# Patient Record
Sex: Female | Born: 1935 | Race: White | Hispanic: No | Smoking: Never smoker
Health system: Southern US, Community
[De-identification: ages and names within clinical notes are randomized; demographics above are authoritative.]

## PROBLEM LIST (undated history)

## (undated) DIAGNOSIS — T7840XA Allergy, unspecified, initial encounter: Secondary | ICD-10-CM

## (undated) DIAGNOSIS — M1990 Unspecified osteoarthritis, unspecified site: Secondary | ICD-10-CM

## (undated) DIAGNOSIS — I059 Rheumatic mitral valve disease, unspecified: Secondary | ICD-10-CM

## (undated) DIAGNOSIS — M810 Age-related osteoporosis without current pathological fracture: Secondary | ICD-10-CM

## (undated) DIAGNOSIS — E039 Hypothyroidism, unspecified: Secondary | ICD-10-CM

## (undated) DIAGNOSIS — H353190 Nonexudative age-related macular degeneration, unspecified eye, stage unspecified: Secondary | ICD-10-CM

## (undated) DIAGNOSIS — I10 Essential (primary) hypertension: Secondary | ICD-10-CM

## (undated) HISTORY — DX: Allergy, unspecified, initial encounter: T78.40XA

## (undated) HISTORY — DX: Unspecified osteoarthritis, unspecified site: M19.90

## (undated) HISTORY — DX: Age-related osteoporosis without current pathological fracture: M81.0

## (undated) HISTORY — PX: EYE SURGERY: SHX253

## (undated) HISTORY — PX: CATARACT EXTRACTION: SUR2

## (undated) HISTORY — DX: Rheumatic mitral valve disease, unspecified: I05.9

## (undated) HISTORY — DX: Nonexudative age-related macular degeneration, unspecified eye, stage unspecified: H35.3190

## (undated) HISTORY — DX: Hypothyroidism, unspecified: E03.9

## (undated) HISTORY — PX: OOPHORECTOMY: SHX86

## (undated) HISTORY — DX: Essential (primary) hypertension: I10

## (undated) HISTORY — PX: SMALL BOWEL REPAIR: SHX6447

## (undated) HISTORY — PX: APPENDECTOMY: SHX54

## (undated) HISTORY — PX: TONSILLECTOMY: SUR1361

---

## 1973-09-07 HISTORY — PX: ABDOMINAL HYSTERECTOMY: SHX81

## 2016-11-05 DIAGNOSIS — H353222 Exudative age-related macular degeneration, left eye, with inactive choroidal neovascularization: Secondary | ICD-10-CM | POA: Diagnosis not present

## 2016-11-05 DIAGNOSIS — H353112 Nonexudative age-related macular degeneration, right eye, intermediate dry stage: Secondary | ICD-10-CM | POA: Diagnosis not present

## 2017-02-22 DIAGNOSIS — L814 Other melanin hyperpigmentation: Secondary | ICD-10-CM | POA: Diagnosis not present

## 2017-02-22 DIAGNOSIS — D1801 Hemangioma of skin and subcutaneous tissue: Secondary | ICD-10-CM | POA: Diagnosis not present

## 2017-02-22 DIAGNOSIS — H353112 Nonexudative age-related macular degeneration, right eye, intermediate dry stage: Secondary | ICD-10-CM | POA: Diagnosis not present

## 2017-02-22 DIAGNOSIS — H353222 Exudative age-related macular degeneration, left eye, with inactive choroidal neovascularization: Secondary | ICD-10-CM | POA: Diagnosis not present

## 2017-03-04 DIAGNOSIS — Z803 Family history of malignant neoplasm of breast: Secondary | ICD-10-CM | POA: Diagnosis not present

## 2017-03-04 DIAGNOSIS — Z1231 Encounter for screening mammogram for malignant neoplasm of breast: Secondary | ICD-10-CM | POA: Diagnosis not present

## 2017-03-04 LAB — HM MAMMOGRAPHY

## 2017-05-04 DIAGNOSIS — Z01419 Encounter for gynecological examination (general) (routine) without abnormal findings: Secondary | ICD-10-CM | POA: Diagnosis not present

## 2017-05-04 DIAGNOSIS — H353131 Nonexudative age-related macular degeneration, bilateral, early dry stage: Secondary | ICD-10-CM | POA: Diagnosis not present

## 2017-05-20 DIAGNOSIS — J019 Acute sinusitis, unspecified: Secondary | ICD-10-CM | POA: Diagnosis not present

## 2017-05-31 DIAGNOSIS — H353222 Exudative age-related macular degeneration, left eye, with inactive choroidal neovascularization: Secondary | ICD-10-CM | POA: Diagnosis not present

## 2017-05-31 DIAGNOSIS — H353112 Nonexudative age-related macular degeneration, right eye, intermediate dry stage: Secondary | ICD-10-CM | POA: Diagnosis not present

## 2017-06-16 DIAGNOSIS — R030 Elevated blood-pressure reading, without diagnosis of hypertension: Secondary | ICD-10-CM | POA: Diagnosis not present

## 2017-06-16 DIAGNOSIS — E039 Hypothyroidism, unspecified: Secondary | ICD-10-CM | POA: Diagnosis not present

## 2017-06-16 DIAGNOSIS — Z0001 Encounter for general adult medical examination with abnormal findings: Secondary | ICD-10-CM | POA: Diagnosis not present

## 2017-06-16 DIAGNOSIS — M810 Age-related osteoporosis without current pathological fracture: Secondary | ICD-10-CM | POA: Diagnosis not present

## 2017-06-16 LAB — HEPATIC FUNCTION PANEL
ALK PHOS: 66 (ref 25–125)
ALT: 13 (ref 7–35)
AST: 21 (ref 13–35)
Bilirubin, Total: 0.6

## 2017-06-16 LAB — LIPID PANEL
CHOLESTEROL: 215 — AB (ref 0–200)
HDL: 73 — AB (ref 35–70)
LDL CALC: 129
TRIGLYCERIDES: 65 (ref 40–160)

## 2017-06-16 LAB — BASIC METABOLIC PANEL
BUN: 11 (ref 4–21)
CREATININE: 0.8 (ref <=1.1)
Glucose: 73
Potassium: 4.6 (ref 3.4–5.3)
Sodium: 142 (ref 137–147)

## 2017-06-16 LAB — CBC AND DIFFERENTIAL
HEMATOCRIT: 41 (ref 36–46)
HEMOGLOBIN: 13.7 (ref 12.0–16.0)
PLATELETS: 267 (ref 150–399)
WBC: 6.3

## 2017-06-16 LAB — TSH: TSH: 3.95 (ref <=5.90)

## 2017-08-09 DIAGNOSIS — L821 Other seborrheic keratosis: Secondary | ICD-10-CM | POA: Diagnosis not present

## 2017-08-09 DIAGNOSIS — D1801 Hemangioma of skin and subcutaneous tissue: Secondary | ICD-10-CM | POA: Diagnosis not present

## 2017-08-09 DIAGNOSIS — L814 Other melanin hyperpigmentation: Secondary | ICD-10-CM | POA: Diagnosis not present

## 2017-09-09 DIAGNOSIS — H43813 Vitreous degeneration, bilateral: Secondary | ICD-10-CM | POA: Diagnosis not present

## 2017-09-09 DIAGNOSIS — H353222 Exudative age-related macular degeneration, left eye, with inactive choroidal neovascularization: Secondary | ICD-10-CM | POA: Diagnosis not present

## 2017-09-09 DIAGNOSIS — H353112 Nonexudative age-related macular degeneration, right eye, intermediate dry stage: Secondary | ICD-10-CM | POA: Diagnosis not present

## 2017-09-15 DIAGNOSIS — L910 Hypertrophic scar: Secondary | ICD-10-CM | POA: Diagnosis not present

## 2017-12-16 DIAGNOSIS — H353222 Exudative age-related macular degeneration, left eye, with inactive choroidal neovascularization: Secondary | ICD-10-CM | POA: Diagnosis not present

## 2017-12-16 DIAGNOSIS — H353112 Nonexudative age-related macular degeneration, right eye, intermediate dry stage: Secondary | ICD-10-CM | POA: Diagnosis not present

## 2017-12-22 DIAGNOSIS — M546 Pain in thoracic spine: Secondary | ICD-10-CM | POA: Diagnosis not present

## 2017-12-22 DIAGNOSIS — M9901 Segmental and somatic dysfunction of cervical region: Secondary | ICD-10-CM | POA: Diagnosis not present

## 2017-12-22 DIAGNOSIS — M9902 Segmental and somatic dysfunction of thoracic region: Secondary | ICD-10-CM | POA: Diagnosis not present

## 2017-12-22 DIAGNOSIS — M9903 Segmental and somatic dysfunction of lumbar region: Secondary | ICD-10-CM | POA: Diagnosis not present

## 2017-12-22 DIAGNOSIS — M542 Cervicalgia: Secondary | ICD-10-CM | POA: Diagnosis not present

## 2017-12-22 DIAGNOSIS — M545 Low back pain: Secondary | ICD-10-CM | POA: Diagnosis not present

## 2017-12-28 DIAGNOSIS — M546 Pain in thoracic spine: Secondary | ICD-10-CM | POA: Diagnosis not present

## 2017-12-28 DIAGNOSIS — M545 Low back pain: Secondary | ICD-10-CM | POA: Diagnosis not present

## 2017-12-28 DIAGNOSIS — M9902 Segmental and somatic dysfunction of thoracic region: Secondary | ICD-10-CM | POA: Diagnosis not present

## 2017-12-28 DIAGNOSIS — M542 Cervicalgia: Secondary | ICD-10-CM | POA: Diagnosis not present

## 2017-12-28 DIAGNOSIS — M9901 Segmental and somatic dysfunction of cervical region: Secondary | ICD-10-CM | POA: Diagnosis not present

## 2017-12-28 DIAGNOSIS — M9903 Segmental and somatic dysfunction of lumbar region: Secondary | ICD-10-CM | POA: Diagnosis not present

## 2017-12-29 DIAGNOSIS — M546 Pain in thoracic spine: Secondary | ICD-10-CM | POA: Diagnosis not present

## 2017-12-29 DIAGNOSIS — M542 Cervicalgia: Secondary | ICD-10-CM | POA: Diagnosis not present

## 2017-12-29 DIAGNOSIS — M9902 Segmental and somatic dysfunction of thoracic region: Secondary | ICD-10-CM | POA: Diagnosis not present

## 2017-12-29 DIAGNOSIS — M9903 Segmental and somatic dysfunction of lumbar region: Secondary | ICD-10-CM | POA: Diagnosis not present

## 2017-12-29 DIAGNOSIS — M9901 Segmental and somatic dysfunction of cervical region: Secondary | ICD-10-CM | POA: Diagnosis not present

## 2017-12-29 DIAGNOSIS — M545 Low back pain: Secondary | ICD-10-CM | POA: Diagnosis not present

## 2017-12-30 DIAGNOSIS — M9903 Segmental and somatic dysfunction of lumbar region: Secondary | ICD-10-CM | POA: Diagnosis not present

## 2017-12-30 DIAGNOSIS — M546 Pain in thoracic spine: Secondary | ICD-10-CM | POA: Diagnosis not present

## 2017-12-30 DIAGNOSIS — M545 Low back pain: Secondary | ICD-10-CM | POA: Diagnosis not present

## 2017-12-30 DIAGNOSIS — M9902 Segmental and somatic dysfunction of thoracic region: Secondary | ICD-10-CM | POA: Diagnosis not present

## 2017-12-30 DIAGNOSIS — M542 Cervicalgia: Secondary | ICD-10-CM | POA: Diagnosis not present

## 2017-12-30 DIAGNOSIS — M9901 Segmental and somatic dysfunction of cervical region: Secondary | ICD-10-CM | POA: Diagnosis not present

## 2018-01-03 DIAGNOSIS — M545 Low back pain: Secondary | ICD-10-CM | POA: Diagnosis not present

## 2018-01-03 DIAGNOSIS — M9902 Segmental and somatic dysfunction of thoracic region: Secondary | ICD-10-CM | POA: Diagnosis not present

## 2018-01-03 DIAGNOSIS — M542 Cervicalgia: Secondary | ICD-10-CM | POA: Diagnosis not present

## 2018-01-03 DIAGNOSIS — M546 Pain in thoracic spine: Secondary | ICD-10-CM | POA: Diagnosis not present

## 2018-01-03 DIAGNOSIS — M9903 Segmental and somatic dysfunction of lumbar region: Secondary | ICD-10-CM | POA: Diagnosis not present

## 2018-01-03 DIAGNOSIS — M9901 Segmental and somatic dysfunction of cervical region: Secondary | ICD-10-CM | POA: Diagnosis not present

## 2018-01-06 DIAGNOSIS — M9901 Segmental and somatic dysfunction of cervical region: Secondary | ICD-10-CM | POA: Diagnosis not present

## 2018-01-06 DIAGNOSIS — M542 Cervicalgia: Secondary | ICD-10-CM | POA: Diagnosis not present

## 2018-01-06 DIAGNOSIS — M9903 Segmental and somatic dysfunction of lumbar region: Secondary | ICD-10-CM | POA: Diagnosis not present

## 2018-01-06 DIAGNOSIS — M9902 Segmental and somatic dysfunction of thoracic region: Secondary | ICD-10-CM | POA: Diagnosis not present

## 2018-01-06 DIAGNOSIS — M546 Pain in thoracic spine: Secondary | ICD-10-CM | POA: Diagnosis not present

## 2018-01-06 DIAGNOSIS — M545 Low back pain: Secondary | ICD-10-CM | POA: Diagnosis not present

## 2018-01-11 DIAGNOSIS — M9901 Segmental and somatic dysfunction of cervical region: Secondary | ICD-10-CM | POA: Diagnosis not present

## 2018-01-11 DIAGNOSIS — M545 Low back pain: Secondary | ICD-10-CM | POA: Diagnosis not present

## 2018-01-11 DIAGNOSIS — M542 Cervicalgia: Secondary | ICD-10-CM | POA: Diagnosis not present

## 2018-01-11 DIAGNOSIS — M9903 Segmental and somatic dysfunction of lumbar region: Secondary | ICD-10-CM | POA: Diagnosis not present

## 2018-01-11 DIAGNOSIS — M9902 Segmental and somatic dysfunction of thoracic region: Secondary | ICD-10-CM | POA: Diagnosis not present

## 2018-01-11 DIAGNOSIS — M546 Pain in thoracic spine: Secondary | ICD-10-CM | POA: Diagnosis not present

## 2018-01-17 DIAGNOSIS — M9901 Segmental and somatic dysfunction of cervical region: Secondary | ICD-10-CM | POA: Diagnosis not present

## 2018-01-17 DIAGNOSIS — M542 Cervicalgia: Secondary | ICD-10-CM | POA: Diagnosis not present

## 2018-01-17 DIAGNOSIS — M9903 Segmental and somatic dysfunction of lumbar region: Secondary | ICD-10-CM | POA: Diagnosis not present

## 2018-01-17 DIAGNOSIS — M9902 Segmental and somatic dysfunction of thoracic region: Secondary | ICD-10-CM | POA: Diagnosis not present

## 2018-01-17 DIAGNOSIS — M546 Pain in thoracic spine: Secondary | ICD-10-CM | POA: Diagnosis not present

## 2018-01-17 DIAGNOSIS — M545 Low back pain: Secondary | ICD-10-CM | POA: Diagnosis not present

## 2018-04-05 DIAGNOSIS — E039 Hypothyroidism, unspecified: Secondary | ICD-10-CM | POA: Diagnosis not present

## 2018-04-05 DIAGNOSIS — I10 Essential (primary) hypertension: Secondary | ICD-10-CM | POA: Diagnosis not present

## 2018-04-05 DIAGNOSIS — I059 Rheumatic mitral valve disease, unspecified: Secondary | ICD-10-CM | POA: Diagnosis not present

## 2018-04-05 LAB — BASIC METABOLIC PANEL
BUN: 15 (ref 4–21)
CREATININE: 0.8 (ref <=1.1)
Glucose: 91
POTASSIUM: 4.3 (ref 3.4–5.3)
Sodium: 144 (ref 137–147)

## 2018-04-05 LAB — TSH: TSH: 0.16 — AB (ref 0.41–5.90)

## 2018-04-06 DIAGNOSIS — Z780 Asymptomatic menopausal state: Secondary | ICD-10-CM | POA: Diagnosis not present

## 2018-04-06 DIAGNOSIS — Z9223 Personal history of estrogen therapy: Secondary | ICD-10-CM | POA: Diagnosis not present

## 2018-04-06 DIAGNOSIS — Z79899 Other long term (current) drug therapy: Secondary | ICD-10-CM | POA: Diagnosis not present

## 2018-04-06 DIAGNOSIS — Z1231 Encounter for screening mammogram for malignant neoplasm of breast: Secondary | ICD-10-CM | POA: Diagnosis not present

## 2018-04-06 DIAGNOSIS — Z79890 Hormone replacement therapy (postmenopausal): Secondary | ICD-10-CM | POA: Diagnosis not present

## 2018-04-06 DIAGNOSIS — M810 Age-related osteoporosis without current pathological fracture: Secondary | ICD-10-CM | POA: Diagnosis not present

## 2018-04-06 DIAGNOSIS — E079 Disorder of thyroid, unspecified: Secondary | ICD-10-CM | POA: Diagnosis not present

## 2018-04-06 LAB — HM MAMMOGRAPHY

## 2018-04-07 LAB — HM DEXA SCAN

## 2018-04-27 DIAGNOSIS — H353131 Nonexudative age-related macular degeneration, bilateral, early dry stage: Secondary | ICD-10-CM | POA: Diagnosis not present

## 2018-06-30 DIAGNOSIS — Z01419 Encounter for gynecological examination (general) (routine) without abnormal findings: Secondary | ICD-10-CM | POA: Diagnosis not present

## 2018-07-05 ENCOUNTER — Encounter: Payer: Self-pay | Admitting: Family Medicine

## 2018-07-05 NOTE — Progress Notes (Signed)
Medical records are received from previous PCP [facility] internal medicine in [place].  These were personally reviewed by me  - Most recent office visit 04/05/2018 showed medication list of calcium 600 mg twice daily, lisinopril 10 mg daily, Synthroid 100 mcg daily-brand-name only, vitamin D 1000 units twice daily, and Vivelle-Dot 0.025 mg daily - Last mammogram 03/04/2017 BI-RADS 2-abstracted into the chart -Past medical history Per previous PCP includes hypothyroidism, dry macular degeneration, mitral valve disorder, and osteoporosis -Family medical history shows that her mother and father both died in car accident and her sister has a history of breast cancer at unknown age - Note states that she is a never smoker and uses no tobacco, alcohol, or drugs - She has no surgical history -Most recent labs from 2018 in 2019 abstracted into the chart -Last Medicare annual wellness visit 06/16/2017 -No vaccination record attached  Virginia Crews, MD, MPH [facility] 07/05/2018 10:46 AM

## 2018-07-08 ENCOUNTER — Ambulatory Visit (INDEPENDENT_AMBULATORY_CARE_PROVIDER_SITE_OTHER): Payer: Medicare Other | Admitting: Family Medicine

## 2018-07-08 ENCOUNTER — Encounter: Payer: Self-pay | Admitting: Family Medicine

## 2018-07-08 VITALS — BP 174/89 | HR 65 | Temp 97.6°F | Ht 64.5 in | Wt 122.4 lb

## 2018-07-08 DIAGNOSIS — E039 Hypothyroidism, unspecified: Secondary | ICD-10-CM

## 2018-07-08 DIAGNOSIS — I10 Essential (primary) hypertension: Secondary | ICD-10-CM | POA: Diagnosis not present

## 2018-07-08 DIAGNOSIS — M810 Age-related osteoporosis without current pathological fracture: Secondary | ICD-10-CM | POA: Insufficient documentation

## 2018-07-08 DIAGNOSIS — H353190 Nonexudative age-related macular degeneration, unspecified eye, stage unspecified: Secondary | ICD-10-CM | POA: Insufficient documentation

## 2018-07-08 DIAGNOSIS — Q782 Osteopetrosis: Secondary | ICD-10-CM | POA: Insufficient documentation

## 2018-07-08 MED ORDER — AMLODIPINE BESYLATE 5 MG PO TABS: 5.0000 mg | ORAL_TABLET | Freq: Every day | ORAL | 3 refills | Status: DC

## 2018-07-08 NOTE — Progress Notes (Signed)
Patient: Megan Woods, Female    DOB: [birth year], 83 y.o.   MRN: [id number] Visit Date: 07/11/2018  Today's Provider: Lavon Paganini, MD   Chief Complaint  Patient presents with  . Establish Care   Subjective:    New Patient:  Megan Woods is a 83 y.o. female who presents today to establish care. Patient was previously a patient at [facility] in [place].  PCP retired a few months ago.  She feels well. She reports exercising walking 1.5 miles 3 days per week. She reports she is sleeping well.  Osteoporosis.  She states that DEXA was in 01/2018 (she also had a mammogram at this time)  Hypothyroidism: Taking Synthroid 168mcg daily with good compliance. No skin/hair changes, constipaiton, diarrhea, palpitations, cold/heat intolerance  Dry Macular degeneration: Sees Optho in [place] (where she lives in the winter).  Has injections periodically.  Sees optometry annually in [place]. Hasn't had any problems recently  She is stressed currently because husband was recently diagnosed with metastatic prostate cancer.  They have ended up staying here longer than planned.  This has caused high blood pressure (was also high at Gyn last week).   Started elevation in July 2019 after his diagnosis.  Was prescribed low dose lisinopril but admits that she never took it.  She lives in [place], here in [place], [place], and [place].  She and her husband travel between these places and own homes in all 5 locations. -----------------------------------------------------------------   Review of Systems  Constitutional: Negative.   HENT: Negative.   Eyes: Positive for photophobia.  Respiratory: Negative.   Cardiovascular: Negative.   Gastrointestinal: Negative.   Endocrine: Negative.   Genitourinary: Negative.   Musculoskeletal: Negative.   Skin: Negative.   Allergic/Immunologic: Positive for food allergies.  Neurological: Negative.   Hematological: Negative.     Psychiatric/Behavioral: Negative.     Social History      She  reports that she has never smoked. She has never used smokeless tobacco. She reports that she does not drink alcohol or use drugs.       Social History   Socioeconomic History  . Marital status: Married    Spouse name: Not on file  . Number of children: Not on file  . Years of education: Not on file  . Highest education level: Not on file  Occupational History  . Not on file  Social Needs  . Financial resource strain: Not on file  . Food insecurity:    Worry: Not on file    Inability: Not on file  . Transportation needs:    Medical: Not on file    Non-medical: Not on file  Tobacco Use  . Smoking status: Never Smoker  . Smokeless tobacco: Never Used  Substance and Sexual Activity  . Alcohol use: Never    Frequency: Never  . Drug use: Never  . Sexual activity: Not on file  Lifestyle  . Physical activity:    Days per week: Not on file    Minutes per session: Not on file  . Stress: Not on file  Relationships  . Social connections:    Talks on phone: Not on file    Gets together: Not on file    Attends religious service: Not on file    Active member of club or organization: Not on file    Attends meetings of clubs or organizations: Not on file    Relationship status: Not on file  Other Topics Concern  . Not on file  Social History Narrative  . Not on file    Past Medical History:  Diagnosis Date  . Allergy   . 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 Hypothyroidism   . Mitral valve disorder   . Osteoporosis      Patient Active Problem List   Diagnosis Date Noted  . Essential hypertension 07/11/2018  . 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 Hypothyroidism   . Osteoporosis     Past Surgical History:  Procedure Laterality Date  . ABDOMINAL HYSTERECTOMY  1975   also took out L ovary and cervix  . APPENDECTOMY    . CATARACT EXTRACTION    . OOPHORECTOMY     R ovary removed many years  after hysterectomy for benign cyst  . SMALL BOWEL REPAIR     perforated during oophorectomy  . TONSILLECTOMY      Family History        Family Status  Relation Name Status  . Mother  Deceased       car accident  . Father  Deceased       car accident   . Sister  (Not Specified)        Her family history includes Breast cancer (age of onset: 32) in her sister.      Allergies  Allergen Reactions  . Chocolate     Other reaction(s): other/intolerance  . Iodine     Other reaction(s): mild rash/itching  . Penicillins Swelling    Other reaction(s): mild rash/itching  . Shellfish Allergy     Other reaction(s): other/intolerance "I pass out"  . Sulfur     Other reaction(s): mild rash/itching     Current Outpatient Medications:  .  Calcium Carbonate (CALCIUM 600 PO), Take 600 mg by mouth 2 (two) times daily., Disp: , Rfl:  .  Cholecalciferol (VITAMIN D PO), Take 1,000 mg by mouth daily., Disp: , Rfl:  .  estradiol (VIVELLE-DOT) 0.0375 MG/24HR, Place onto the skin., Disp: , Rfl:  .  levothyroxine (SYNTHROID, LEVOTHROID) 100 MCG tablet, Take by mouth., Disp: , Rfl:  .  amLODipine (NORVASC) 5 MG tablet, Take 1 tablet (5 mg total) by mouth daily., Disp: 30 tablet, Rfl: 3   Patient Care Team: [place] Crews, MD as PCP - General (Family Medicine)      Objective:   Vitals: BP (!) 174/89 (BP Location: Right Arm, Patient Position: Sitting, Cuff Size: Normal)   Pulse 65   Temp 97.6 F (36.4 C) (Oral)   Ht 5' 4.5" (1.638 m)   Wt 122 lb 6.4 oz (55.5 kg)   SpO2 97%   BMI 20.69 kg/m    Vitals:   07/08/18 1534  BP: (!) 174/89  Pulse: 65  Temp: 97.6 F (36.4 C)  TempSrc: Oral  SpO2: 97%  Weight: 122 lb 6.4 oz (55.5 kg)  Height: 5' 4.5" (1.638 m)     Physical Exam  Constitutional: She is oriented to person, place, and time. She appears well-developed and well-nourished. No distress.  HENT:  Head: Normocephalic and atraumatic.  Right Ear: External ear normal.  Left  Ear: External ear normal.  Nose: Nose normal.  Mouth/Throat: Oropharynx is clear and moist.  Eyes: Pupils are equal, round, and reactive to light. Conjunctivae and EOM are normal. No scleral icterus.  Neck: Neck supple. No thyromegaly present.  Cardiovascular: Normal rate, regular rhythm, normal heart sounds and intact distal pulses.  No murmur heard. Pulmonary/Chest: Effort normal  and breath sounds normal. No respiratory distress. She has no wheezes. She has no rales.  Abdominal: Soft. Bowel sounds are normal. She exhibits no distension. There is no tenderness. There is no rebound and no guarding.  Musculoskeletal: She exhibits no edema or deformity.  Lymphadenopathy:    She has no cervical adenopathy.  Neurological: She is alert and oriented to person, place, and time.  Skin: Skin is warm and dry. Capillary refill takes less than 2 seconds. No rash noted.  Psychiatric: She has a normal mood and affect. Her behavior is normal.  Vitals reviewed.    Depression Screen PHQ 2/9 Scores 07/08/2018  PHQ - 2 Score 0  PHQ- 9 Score 0      Assessment & Plan:     Establish Care  Exercise Activities and Dietary recommendations Goals   None      There is no immunization history on file for this patient.  Health Maintenance  Topic Date Due  . TETANUS/TDAP  06/04/1956  . PNA vac Low Risk Adult (1 of 2 - PCV13) 06/04/2002  . INFLUENZA VACCINE  01/05/2019 (Originally 04/07/2018)  . DEXA SCAN  Completed     Discussed health benefits of physical activity, and encouraged her to engage in regular exercise appropriate for her age and condition.   See separate note from records review   --------------------------------------------------------------------  Problem List Items Addressed This Visit      Cardiovascular and Mediastinum   Essential hypertension    New diagnosis Patient is wary of taking antihypertensives because she believes that BP is only elevated due to  stress Discussed that no matter why BP is elevated, if it is consistently elevated, it needs to be lowered to lower health risks, including risk of MI and CVA She agrees to try amlodipine 5mg  daily Will f/u in 1 month and see how BP is and possible titrate dose Reviewed recent BMP      Relevant Medications   amLODipine (NORVASC) 5 MG tablet     Endocrine   Hypothyroidism - Primary    Previously well controlled Reviewed records from previous PCP Will abstract last TSH Continue current Synthroid dose      Relevant Medications   levothyroxine (SYNTHROID, LEVOTHROID) 100 MCG tablet     Musculoskeletal and Integument   Osteoporosis    Reviewed recent DEXA She is on HRT      Relevant Medications   Calcium Carbonate (CALCIUM 600 PO)   Cholecalciferol (VITAMIN D PO)     Other   Dry age-related macular degeneration    Followed by Optho No red flags currently          Return in about 6 months (around 01/06/2019) for AWV/CPE. And in 1 month for BP f/u   Approximately 45 minutes was spent in discussion of which greater than 50% was consultation.   The entirety of the information documented in the History of Present Illness, Review of Systems and Physical Exam were personally obtained by me. Portions of this information were initially documented by Tiburcio Pea, CMA and reviewed by me for thoroughness and accuracy.    [place]ia Crews, MD, MPH [facility] 07/11/2018 11:01 AM

## 2018-07-08 NOTE — Patient Instructions (Signed)

## 2018-07-11 DIAGNOSIS — I10 Essential (primary) hypertension: Secondary | ICD-10-CM | POA: Insufficient documentation

## 2018-07-11 NOTE — Assessment & Plan Note (Signed)
Reviewed recent DEXA She is on HRT

## 2018-07-11 NOTE — Assessment & Plan Note (Signed)
Followed by Optho No red flags currently

## 2018-07-11 NOTE — Assessment & Plan Note (Signed)
New diagnosis Patient is wary of taking antihypertensives because she believes that BP is only elevated due to stress Discussed that no matter why BP is elevated, if it is consistently elevated, it needs to be lowered to lower health risks, including risk of MI and CVA She agrees to try amlodipine 5mg  daily Will f/u in 1 month and see how BP is and possible titrate dose Reviewed recent BMP

## 2018-07-11 NOTE — Assessment & Plan Note (Signed)
Previously well controlled Reviewed records from previous PCP Will abstract last TSH Continue current Synthroid dose

## 2018-08-12 ENCOUNTER — Ambulatory Visit (INDEPENDENT_AMBULATORY_CARE_PROVIDER_SITE_OTHER): Payer: Medicare Other | Admitting: Family Medicine

## 2018-08-12 ENCOUNTER — Encounter: Payer: Self-pay | Admitting: Family Medicine

## 2018-08-12 VITALS — BP 147/75 | HR 77 | Temp 97.7°F | Wt 123.2 lb

## 2018-08-12 DIAGNOSIS — I10 Essential (primary) hypertension: Secondary | ICD-10-CM

## 2018-08-12 DIAGNOSIS — E039 Hypothyroidism, unspecified: Secondary | ICD-10-CM

## 2018-08-12 MED ORDER — AMLODIPINE BESYLATE 2.5 MG PO TABS: 7.5000 mg | ORAL_TABLET | Freq: Every day | ORAL | 3 refills | Status: DC

## 2018-08-12 NOTE — Assessment & Plan Note (Addendum)
Improving but not to goal Tolerating amlodipine well, but still hesitant to take medicaitons She also plans to increase exercise as she goes to [place]  Since she is so hesitant about medicaiton, will compromise and increase amlodipine to 7.5mg  daily She will be out of the area until f/u in May, but she will call if BP

## 2018-08-12 NOTE — Patient Instructions (Signed)

## 2018-08-12 NOTE — Progress Notes (Signed)
Patient: Megan Woods Female    DOB: [birth year]   83 y.o.   MRN: [id number] Visit Date: 08/12/2018  Today's Provider: Lavon Paganini, MD   Chief Complaint  Patient presents with  . Hypertension  . Hypothyroidism   Subjective:    HPI  Hypertension, follow-up:  BP Readings from Last 3 Encounters:  08/12/18 (!) 147/75  07/08/18 (!) 174/89    She was last seen for hypertension 1 months ago.  BP at that visit was 174/89. Management changes since that visit include started Amlodipine 5 mg. She reports good compliance with treatment. She is not having side effects.  She is exercising. She is adherent to low salt diet.   Outside blood pressures are being checked. BP readings are elevated. She is experiencing none.  Patient denies chest pain, chest pressure/discomfort, claudication, dyspnea, exertional chest pressure/discomfort, fatigue, irregular heart beat, lower extremity edema, near-syncope, orthopnea, palpitations, paroxysmal nocturnal dyspnea, syncope and tachypnea.   Cardiovascular risk factors include advanced age (older than 59 for men, 67 for women) and hypertension.  Use of agents associated with hypertension: thyroid hormones.     Weight trend: stable Wt Readings from Last 3 Encounters:  08/12/18 123 lb 3.2 oz (55.9 kg)  07/08/18 122 lb 6.4 oz (55.5 kg)    Current diet: in general, a "healthy" diet  , well balanced  ------------------------------------------------------------------------   Hypothyroid, follow-up:  TSH  Date Value Ref Range Status  04/05/2018 0.16 (A) 0.41 - 5.90 Final  06/16/2017 3.95 0.41 - 5.90 Final   Wt Readings from Last 3 Encounters:  08/12/18 123 lb 3.2 oz (55.9 kg)  07/08/18 122 lb 6.4 oz (55.5 kg)    She was last seen for hypothyroid 1 months ago.  Management since that visit includes no changes. She reports good compliance with treatment. She is not having side effects.  She is exercising. She is experiencing  none She denies change in energy level, diarrhea, heat / cold intolerance, nervousness, palpitations and weight changes Weight trend: stable  ------------------------------------------------------------------------   Allergies  Allergen Reactions  . Chocolate     Other reaction(s): other/intolerance  . Iodine     Other reaction(s): mild rash/itching  . Penicillins Swelling    Other reaction(s): mild rash/itching  . Shellfish Allergy     Other reaction(s): other/intolerance "I pass out"  . Sulfur     Other reaction(s): mild rash/itching     Current Outpatient Medications:  .  amLODipine (NORVASC) 5 MG tablet, Take 1 tablet (5 mg total) by mouth daily., Disp: 30 tablet, Rfl: 3 .  Calcium Carbonate (CALCIUM 600 PO), Take 600 mg by mouth 2 (two) times daily., Disp: , Rfl:  .  Cholecalciferol (VITAMIN D PO), Take 1,000 mg by mouth daily., Disp: , Rfl:  .  estradiol (VIVELLE-DOT) 0.0375 MG/24HR, Place onto the skin., Disp: , Rfl:  .  levothyroxine (SYNTHROID, LEVOTHROID) 100 MCG tablet, Take by mouth., Disp: , Rfl:   Review of Systems  Constitutional: Negative.   Respiratory: Negative.   Cardiovascular: Negative.   Genitourinary: Negative.   Musculoskeletal: Negative.   Neurological: Negative.   Psychiatric/Behavioral: Negative.     Social History   Tobacco Use  . Smoking status: Never Smoker  . Smokeless tobacco: Never Used  Substance Use Topics  . Alcohol use: Never    Frequency: Never   Objective:   BP (!) 147/75 (BP Location: Left Arm, Patient Position: Sitting, Cuff Size: Normal)   Pulse 77   Temp 97.7  F (36.5 C) (Oral)   Wt 123 lb 3.2 oz (55.9 kg)   SpO2 97%   BMI 20.82 kg/m  Vitals:   08/12/18 0953  BP: (!) 147/75  Pulse: 77  Temp: 97.7 F (36.5 C)  TempSrc: Oral  SpO2: 97%  Weight: 123 lb 3.2 oz (55.9 kg)     Physical Exam  Constitutional: She is oriented to person, place, and time. She appears well-developed and well-nourished. No distress.   HENT:  Head: Normocephalic and atraumatic.  Eyes: Conjunctivae are normal. No scleral icterus.  Neck: Neck supple. No thyromegaly present.  Cardiovascular: Normal rate, regular rhythm, normal heart sounds and intact distal pulses.  No murmur heard. Pulmonary/Chest: Effort normal and breath sounds normal. No respiratory distress. She has no wheezes. She has no rales.  Musculoskeletal: She exhibits no edema.  Lymphadenopathy:    She has no cervical adenopathy.  Neurological: She is alert and oriented to person, place, and time.  Skin: Skin is warm and dry. Capillary refill takes less than 2 seconds. No rash noted.  Psychiatric: She has a normal mood and affect. Her behavior is normal.  Vitals reviewed.       Assessment & Plan:   Problem List Items Addressed This Visit      Cardiovascular and Mediastinum   Essential hypertension - Primary    Improving but not to goal Tolerating amlodipine well, but still hesitant to take medicaitons She also plans to increase exercise as she goes to [place]  Since she is so hesitant about medicaiton, will compromise and increase amlodipine to 7.5mg  daily She will be out of the area until f/u in May, but she will call if BP       Relevant Medications   amLODipine (NORVASC) 2.5 MG tablet   Other Relevant Orders   Comprehensive metabolic panel   Lipid panel     Endocrine   Hypothyroidism    Last TSH slightly low Will recheck today Continue Synthroid pending labs      Relevant Orders   TSH       Return in about 5 months (around 01/11/2019) for as scheduled for BP f/u.   The entirety of the information documented in the History of Present Illness, Review of Systems and Physical Exam were personally obtained by me. Portions of this information were initially documented by Tiburcio Pea, CMA and reviewed by me for thoroughness and accuracy.    [place]ia Crews, MD, MPH [facility] 08/12/2018 1:02 PM

## 2018-08-12 NOTE — Assessment & Plan Note (Signed)
Last TSH slightly low Will recheck today Continue Synthroid pending labs

## 2018-08-13 LAB — COMPREHENSIVE METABOLIC PANEL
ALT: 12 IU/L (ref 0–32)
AST: 20 IU/L (ref 0–40)
Albumin/Globulin Ratio: 3.5 — ABNORMAL HIGH (ref 1.2–2.2)
Albumin: 4.5 g/dL (ref 3.5–4.7)
Alkaline Phosphatase: 67 IU/L (ref 39–117)
BUN / CREAT RATIO: 14 (ref 12–28)
BUN: 13 mg/dL (ref 8–27)
Bilirubin Total: 0.4 mg/dL (ref 0.0–1.2)
CO2: 21 mmol/L (ref 20–29)
Calcium: 9.3 mg/dL (ref 8.7–10.3)
Chloride: 104 mmol/L (ref 96–106)
Creatinine, Ser: 0.91 mg/dL (ref 0.57–1.00)
GFR calc non Af Amer: 59 mL/min/{1.73_m2} — ABNORMAL LOW (ref >59)
GFR, EST AFRICAN AMERICAN: 68 mL/min/{1.73_m2} (ref >59)
GLUCOSE: 127 mg/dL — AB (ref 65–99)
Globulin, Total: 1.3 g/dL — ABNORMAL LOW (ref 1.5–4.5)
Potassium: 4.1 mmol/L (ref 3.5–5.2)
SODIUM: 142 mmol/L (ref 134–144)
TOTAL PROTEIN: 5.8 g/dL — AB (ref 6.0–8.5)

## 2018-08-13 LAB — LIPID PANEL
CHOL/HDL RATIO: 2.3 ratio (ref 0.0–4.4)
Cholesterol, Total: 198 mg/dL (ref 100–199)
HDL: 86 mg/dL (ref >39)
LDL CALC: 95 mg/dL (ref 0–99)
TRIGLYCERIDES: 87 mg/dL (ref 0–149)
VLDL Cholesterol Cal: 17 mg/dL (ref 5–40)

## 2018-08-13 LAB — TSH: TSH: 1.8 u[IU]/mL (ref 0.450–4.500)

## 2018-08-15 ENCOUNTER — Telehealth: Payer: Self-pay

## 2018-08-15 ENCOUNTER — Other Ambulatory Visit: Payer: Self-pay

## 2018-08-15 MED ORDER — LEVOTHYROXINE SODIUM 100 MCG PO TABS: 100.0000 ug | ORAL_TABLET | Freq: Every day | ORAL | 3 refills | Status: DC

## 2018-08-15 NOTE — Telephone Encounter (Signed)
-----   Message from [place] Crews, MD sent at 08/15/2018  8:16 AM EST ----- Normal labs

## 2018-08-15 NOTE — Telephone Encounter (Signed)
Patient was advised.  

## 2018-08-15 NOTE — Telephone Encounter (Signed)
Patient is calling office requesting refill on Synthroid, patient would like this sent to [facility] in [facility]. Patient states stores phone number is [phone] and stores address is [address] [place], [place] [address]

## 2018-08-30 ENCOUNTER — Telehealth: Payer: Self-pay

## 2018-08-30 NOTE — Telephone Encounter (Signed)
-----   Message from Trinna Post, [place] sent at 08/30/2018  9:57 AM EST ----- Cholesterol slightly elevated but the benefit of cholesterol medication at this point is questionable. Discuss with dr b at follow up visit but would not start any medication just yet.

## 2018-08-30 NOTE — Telephone Encounter (Signed)
-----   Message from Trinna Post, [place] sent at 08/30/2018  9:55 AM EST ----- Slightly over corrected thyroid. Recommend 88 mcg daily thyroid daily and follow up OV 6 weeks for labs and recheck.

## 2018-08-30 NOTE — Telephone Encounter (Signed)
Patient advised as directed below. Patient reported that at this moment she is not able to schedule a  Follow-up appointment because she is currently staying in [place]. Patient reports that she is going to call to talk to Dr.B

## 2018-10-14 DIAGNOSIS — H353222 Exudative age-related macular degeneration, left eye, with inactive choroidal neovascularization: Secondary | ICD-10-CM | POA: Diagnosis not present

## 2018-10-14 DIAGNOSIS — H353112 Nonexudative age-related macular degeneration, right eye, intermediate dry stage: Secondary | ICD-10-CM | POA: Diagnosis not present

## 2018-10-18 ENCOUNTER — Telehealth: Payer: Self-pay | Admitting: Family Medicine

## 2018-10-18 NOTE — Telephone Encounter (Signed)
Pt's daughter Rolanda Lundborg - [phone]   Pt is needing a colonoscopy referral  prefer -  hospital Week of March 17, 18 and 20  Please advise.  Thanks, [employer]

## 2018-10-19 NOTE — Telephone Encounter (Signed)
LMTCB

## 2018-10-19 NOTE — Telephone Encounter (Signed)
I do not have a report from previous colonoscopy.  Does patient have history of polyps?  If she had normal screenings, we typically stop routine screening at age 84.  We can still place a referral to GI if she prefers, but it is possible they will tell her the same thing

## 2018-10-21 DIAGNOSIS — D485 Neoplasm of uncertain behavior of skin: Secondary | ICD-10-CM | POA: Diagnosis not present

## 2018-10-21 DIAGNOSIS — Z85828 Personal history of other malignant neoplasm of skin: Secondary | ICD-10-CM | POA: Diagnosis not present

## 2018-10-21 NOTE — Telephone Encounter (Signed)
Advised patient as below. Patient's daughter will advise the patient, and will call us back if she wants to proceed with colonoscopy. She is not sure if patient has had any history of colon polyps.

## 2018-11-01 DIAGNOSIS — L820 Inflamed seborrheic keratosis: Secondary | ICD-10-CM | POA: Diagnosis not present

## 2018-11-01 DIAGNOSIS — D485 Neoplasm of uncertain behavior of skin: Secondary | ICD-10-CM | POA: Diagnosis not present

## 2018-11-16 DIAGNOSIS — Z0000 Encounter for general adult medical examination without abnormal findings: Secondary | ICD-10-CM | POA: Diagnosis not present

## 2019-01-06 ENCOUNTER — Encounter: Payer: Self-pay | Admitting: Family Medicine

## 2019-01-06 ENCOUNTER — Ambulatory Visit: Payer: Self-pay

## 2019-01-09 ENCOUNTER — Ambulatory Visit (INDEPENDENT_AMBULATORY_CARE_PROVIDER_SITE_OTHER): Payer: Medicare Other

## 2019-01-09 ENCOUNTER — Other Ambulatory Visit: Payer: Self-pay

## 2019-01-09 DIAGNOSIS — Z0000 Encounter for general adult medical examination without abnormal findings: Secondary | ICD-10-CM

## 2019-01-09 NOTE — Patient Instructions (Addendum)
Ms. Gentles , Thank you for taking time to come for your Medicare Wellness Visit. I appreciate your ongoing commitment to your health goals. Please review the following plan we discussed and let me know if I can assist you in the future.   Screening recommendations/referrals: Colonoscopy: No longer required.  Mammogram: No longer required.  Bone Density: Up to date, due 04/2020 Recommended yearly ophthalmology/optometry visit for glaucoma screening and checkup Recommended yearly dental visit for hygiene and checkup  Vaccinations: Influenza vaccine: Pt declines today.  Pneumococcal vaccine: Pt declines today.  Tdap vaccine: Pt declines today.  Shingles vaccine: Pt declines today.     Advanced directives: Please bring a copy of your POA (Power of Attorney) and/or Living Will to your next appointment.   Conditions/risks identified: None.   Next appointment: 05/02/19 @ 9:20 AM with Dr Brita Romp.   Preventive Care 82 Years and Older, Female Preventive care refers to lifestyle choices and visits with your health care provider that can promote health and wellness. What does preventive care include?  A yearly physical exam. This is also called an annual well check.  Dental exams once or twice a year.  Routine eye exams. Ask your health care provider how often you should have your eyes checked.  Personal lifestyle choices, including:  Daily care of your teeth and gums.  Regular physical activity.  Eating a healthy diet.  Avoiding tobacco and drug use.  Limiting alcohol use.  Practicing safe sex.  Taking low-dose aspirin every day.  Taking vitamin and mineral supplements as recommended by your health care provider. What happens during an annual well check? The services and screenings done by your health care provider during your annual well check will depend on your age, overall health, lifestyle risk factors, and family history of disease. Counseling  Your health care  provider may ask you questions about your:  Alcohol use.  Tobacco use.  Drug use.  Emotional well-being.  Home and relationship well-being.  Sexual activity.  Eating habits.  History of falls.  Memory and ability to understand (cognition).  Work and work Statistician.  Reproductive health. Screening  You may have the following tests or measurements:  Height, weight, and BMI.  Blood pressure.  Lipid and cholesterol levels. These may be checked every 5 years, or more frequently if you are over 61 years old.  Skin check.  Lung cancer screening. You may have this screening every year starting at age 70 if you have a 30-pack-year history of smoking and currently smoke or have quit within the past 15 years.  Fecal occult blood test (FOBT) of the stool. You may have this test every year starting at age 91.  Flexible sigmoidoscopy or colonoscopy. You may have a sigmoidoscopy every 5 years or a colonoscopy every 10 years starting at age 30.  Hepatitis C blood test.  Hepatitis B blood test.  Sexually transmitted disease (STD) testing.  Diabetes screening. This is done by checking your blood sugar (glucose) after you have not eaten for a while (fasting). You may have this done every 1-3 years.  Bone density scan. This is done to screen for osteoporosis. You may have this done starting at age 29.  Mammogram. This may be done every 1-2 years. Talk to your health care provider about how often you should have regular mammograms. Talk with your health care provider about your test results, treatment options, and if necessary, the need for more tests. Vaccines  Your health care provider may recommend certain  vaccines, such as:  Influenza vaccine. This is recommended every year.  Tetanus, diphtheria, and acellular pertussis (Tdap, Td) vaccine. You may need a Td booster every 10 years.  Zoster vaccine. You may need this after age 61.  Pneumococcal 13-valent conjugate (PCV13)  vaccine. One dose is recommended after age 57.  Pneumococcal polysaccharide (PPSV23) vaccine. One dose is recommended after age 54. Talk to your health care provider about which screenings and vaccines you need and how often you need them. This information is not intended to replace advice given to you by your health care provider. Make sure you discuss any questions you have with your health care provider. Document Released: 09/20/2015 Document Revised: 05/13/2016 Document Reviewed: 06/25/2015 Elsevier Interactive Patient Education  2017 [employer] Prevention in the Home Falls can cause injuries. They can happen to people of all ages. There are many things you can do to make your home safe and to help prevent falls. What can I do on the outside of my home?  Regularly fix the edges of walkways and driveways and fix any cracks.  Remove anything that might make you trip as you walk through a door, such as a raised step or threshold.  Trim any bushes or trees on the path to your home.  Use bright outdoor lighting.  Clear any walking paths of anything that might make someone trip, such as rocks or tools.  Regularly check to see if handrails are loose or broken. Make sure that both sides of any steps have handrails.  Any raised decks and porches should have guardrails on the edges.  Have any leaves, snow, or ice cleared regularly.  Use sand or salt on walking paths during winter.  Clean up any spills in your garage right away. This includes oil or grease spills. What can I do in the bathroom?  Use night lights.  Install grab bars by the toilet and in the tub and shower. Do not use towel bars as grab bars.  Use non-skid mats or decals in the tub or shower.  If you need to sit down in the shower, use a plastic, non-slip stool.  Keep the floor dry. Clean up any water that spills on the floor as soon as it happens.  Remove soap buildup in the tub or shower regularly.   Attach bath mats securely with double-sided non-slip rug tape.  Do not have throw rugs and other things on the floor that can make you trip. What can I do in the bedroom?  Use night lights.  Make sure that you have a light by your bed that is easy to reach.  Do not use any sheets or blankets that are too big for your bed. They should not hang down onto the floor.  Have a firm chair that has side arms. You can use this for support while you get dressed.  Do not have throw rugs and other things on the floor that can make you trip. What can I do in the kitchen?  Clean up any spills right away.  Avoid walking on wet floors.  Keep items that you use a lot in easy-to-reach places.  If you need to reach something above you, use a strong step stool that has a grab bar.  Keep electrical cords out of the way.  Do not use floor polish or wax that makes floors slippery. If you must use wax, use non-skid floor wax.  Do not have throw rugs and other things  on the floor that can make you trip. What can I do with my stairs?  Do not leave any items on the stairs.  Make sure that there are handrails on both sides of the stairs and use them. Fix handrails that are broken or loose. Make sure that handrails are as long as the stairways.  Check any carpeting to make sure that it is firmly attached to the stairs. Fix any carpet that is loose or worn.  Avoid having throw rugs at the top or bottom of the stairs. If you do have throw rugs, attach them to the floor with carpet tape.  Make sure that you have a light switch at the top of the stairs and the bottom of the stairs. If you do not have them, ask someone to add them for you. What else can I do to help prevent falls?  Wear shoes that:  Do not have high heels.  Have rubber bottoms.  Are comfortable and fit you well.  Are closed at the toe. Do not wear sandals.  If you use a stepladder:  Make sure that it is fully opened. Do not climb  a closed stepladder.  Make sure that both sides of the stepladder are locked into place.  Ask someone to hold it for you, if possible.  Clearly mark and make sure that you can see:  Any grab bars or handrails.  First and last steps.  Where the edge of each step is.  Use tools that help you move around (mobility aids) if they are needed. These include:  Canes.  Walkers.  Scooters.  Crutches.  Turn on the lights when you go into a dark area. Replace any light bulbs as soon as they burn out.  Set up your furniture so you have a clear path. Avoid moving your furniture around.  If any of your floors are uneven, fix them.  If there are any pets around you, be aware of where they are.  Review your medicines with your doctor. Some medicines can make you feel dizzy. This can increase your chance of falling. Ask your doctor what other things that you can do to help prevent falls. This information is not intended to replace advice given to you by your health care provider. Make sure you discuss any questions you have with your health care provider. Document Released: 06/20/2009 Document Revised: 01/30/2016 Document Reviewed: 09/28/2014 Elsevier Interactive Patient Education  2017 [employer].

## 2019-01-09 NOTE — Progress Notes (Signed)
Subjective:   Lenise Jr is a 84 y.o. female who presents for an Initial Medicare Annual Wellness Visit.    This visit is being conducted through telemedicine due to the COVID-19 pandemic. This patient has given me verbal consent via doximity to conduct this visit, patient states they are participating from their home address. Some vital signs may be absent or patient reported.    Patient identification: identified by name, DOB, and current address  Review of Systems    N/A  Cardiac Risk Factors include: advanced age (>36men, >51 women);hypertension     Objective:    Today's Vitals   01/09/19 1420 01/09/19 1439  PainSc: 0-No pain 0-No pain   There is no height or weight on file to calculate BMI. Unable to obtain vitals due to visit being conducted via telephonically.    Advanced Directives 01/09/2019  Does Patient Have a Medical Advance Directive? Yes  Type of Paramedic of [place];Living will  Copy of [place] in Chart? No - copy requested    Current Medications (verified) Outpatient Encounter Medications as of 01/09/2019  Medication Sig  . amLODipine (NORVASC) 2.5 MG tablet Take 3 tablets (7.5 mg total) by mouth daily.  . Calcium Carbonate (CALCIUM 600 PO) Take 600 mg by mouth 2 (two) times daily.  . Cholecalciferol (VITAMIN D PO) Take 1,000 mg by mouth daily.  Marland Kitchen estradiol (VIVELLE-DOT) 0.0375 MG/24HR Place 1 patch onto the skin 2 (two) times a week.   . levothyroxine (SYNTHROID, LEVOTHROID) 100 MCG tablet Take 1 tablet (100 mcg total) by mouth daily before breakfast. Brand name only   No facility-administered encounter medications on file as of 01/09/2019.     Allergies (verified) Chocolate; Iodine; Penicillins; Shellfish allergy; and Sulfur   History: Past Medical History:  Diagnosis Date  . Allergy   . 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 Hypertension   . Hypothyroidism   . Mitral valve disorder   . Osteoporosis     Past Surgical History:  Procedure Laterality Date  . ABDOMINAL HYSTERECTOMY  1975   also took out L ovary and cervix  . APPENDECTOMY    . CATARACT EXTRACTION    . OOPHORECTOMY     R ovary removed many years after hysterectomy for benign cyst  . SMALL BOWEL REPAIR     perforated during oophorectomy  . TONSILLECTOMY     Family History  Problem Relation Age of Onset  . Breast cancer Sister 15   Social History   Socioeconomic History  . Marital status: Married    Spouse name: Not on file  . Number of children: 3  . Years of education: Not on file  . Highest education level: Master's degree (e.g., MA, MS, MEng, MEd, MSW, MBA)  Occupational History  . Occupation: retired  Scientific laboratory technician  . Financial resource strain: Not hard at all  . Food insecurity:    Worry: Never true    Inability: Never true  . Transportation needs:    Medical: No    Non-medical: No  Tobacco Use  . Smoking status: Never Smoker  . Smokeless tobacco: Never Used  Substance and Sexual Activity  . Alcohol use: Never    Frequency: Never  . Drug use: Never  . Sexual activity: Not on file  Lifestyle  . Physical activity:    Days per week: 5 days    Minutes per session: 40 min  . Stress: To some extent  Relationships  . Social  connections:    Talks on phone: Patient refused    Gets together: Patient refused    Attends religious service: Patient refused    Active member of club or organization: Patient refused    Attends meetings of clubs or organizations: Patient refused    Relationship status: Patient refused  Other Topics Concern  . Not on file  Social History Narrative  . Not on file    Tobacco Counseling Counseling given: Not Answered   Clinical Intake:  Pre-visit preparation completed: Yes  Pain : No/denies pain Pain Score: 0-No pain     Nutritional Status: BMI of 19-24  Normal Nutritional Risks: None Diabetes: No  How often do you need to have someone help you when you read  instructions, pamphlets, or other written materials from your doctor or pharmacy?: 1 - Never  Interpreter Needed?: No  Information entered by :: [facility], LPN   Activities of Daily Living In your present state of health, do you have any difficulty performing the following activities: 01/09/2019 07/08/2018  Hearing? Tempie Donning  Comment Wears bilateral hearing aids.  -  Vision? N N  Comment Wears readers as needed.  -  Difficulty concentrating or making decisions? N N  Walking or climbing stairs? N N  Dressing or bathing? N N  Doing errands, shopping? N N  Preparing Food and eating ? N -  Using the Toilet? N -  In the past six months, have you accidently leaked urine? N -  Do you have problems with loss of bowel control? N -  Managing your Medications? N -  Managing your Finances? N -  Housekeeping or managing your Housekeeping? N -     Immunizations and Health Maintenance  There is no immunization history on file for this patient. Health Maintenance Due  Topic Date Due  . PNA vac Low Risk Adult (1 of 2 - PCV13) 06/04/2002    Patient Care Team: Virginia Crews, MD as PCP - General (Family Medicine)  Indicate any recent Medical Services you may have received from other than [facility] providers in the past year (date may be approximate).     Assessment:   This is a routine wellness examination for Mihaela.  Hearing/Vision screen No exam data present  Dietary issues and exercise activities discussed: Current Exercise Habits: Home exercise routine, Type of exercise: walking;strength training/weights;stretching, Time (Minutes): 45, Frequency (Times/Week): 5, Weekly Exercise (Minutes/Week): 225, Intensity: Moderate, Exercise limited by: None identified  Goals   None    Depression Screen PHQ 2/9 Scores 01/09/2019 07/08/2018  PHQ - 2 Score 0 0  PHQ- 9 Score - 0    Fall Risk Fall Risk  01/09/2019 07/08/2018  Falls in the past year? 0 0   FALL RISK PREVENTION PERTAINING TO THE HOME:   Any stairs in or around the home? Yes  If so, are there any without handrails? Yes   Home free of loose throw rugs in walkways, pet beds, electrical cords, etc? Yes  Adequate lighting in your home to reduce risk of falls? Yes   ASSISTIVE DEVICES UTILIZED TO PREVENT FALLS:  Life alert? No  Use of a cane, walker or w/c? No  Grab bars in the bathroom? Yes  Shower chair or bench in shower? Yes  Elevated toilet seat or a handicapped toilet? Yes    TIMED UP AND GO:  Was the test performed? No .     Cognitive Function:     6CIT Screen 01/09/2019  What Year? 0  points  What month? 0 points  What time? 0 points  Count back from 20 0 points  Months in reverse 0 points  Repeat phrase 0 points  Total Score 0    Screening Tests Health Maintenance  Topic Date Due  . PNA vac Low Risk Adult (1 of 2 - PCV13) 06/04/2002  . TETANUS/TDAP  08/13/2023 (Originally 06/04/1954)  . INFLUENZA VACCINE  04/08/2019  . DEXA SCAN  04/07/2020    Qualifies for Shingles Vaccine? Yes . Due for Shingrix. Education has been provided regarding the importance of this vaccine. Pt has been advised to call insurance company to determine out of pocket expense. Advised may also receive vaccine at local pharmacy or Health Dept. Verbalized acceptance and understanding.  Tdap: Pt declines today.   Flu Vaccine: Pt declines today.   Pneumococcal Vaccine: Pt declines today.   Cancer Screenings:  Colorectal Screening: No longer required.   Mammogram: No longer required.   Bone Density: Completed 04/07/18. Results reflect OSTEOPOROSIS. Repeat every 2 years.   Lung Cancer Screening: (Low Dose CT Chest recommended if Age 75-80 years, 30 pack-year currently smoking OR have quit w/in 15years.) does not qualify.   Additional Screening:  Vision Screening: Recommended annual ophthalmology exams for early detection of glaucoma and other disorders of the eye.  Dental Screening: Recommended annual dental exams for  proper oral hygiene  Community Resource Referral:  CRR required this visit?  No       Plan:  I have personally reviewed and addressed the Medicare Annual Wellness questionnaire and have noted the following in the patient's chart:  A. Medical and social history B. Use of alcohol, tobacco or illicit drugs  C. Current medications and supplements D. Functional ability and status E.  Nutritional status F.  Physical activity G. Advance directives H. List of other physicians I.  Hospitalizations, surgeries, and ER visits in previous 12 months J.  [place] such as hearing and vision if needed, cognitive and depression L. Referrals and appointments   In addition, I have reviewed and discussed with patient certain preventive protocols, quality metrics, and best practice recommendations. A written personalized care plan for preventive services as well as general preventive health recommendations were provided to patient.   Glendora Score, [place]   09/10/9700  Nurse Health Advisor   Nurse Notes: Declined future orders for the tetanus and pneumonia vaccines.

## 2019-01-12 DIAGNOSIS — H353112 Nonexudative age-related macular degeneration, right eye, intermediate dry stage: Secondary | ICD-10-CM | POA: Diagnosis not present

## 2019-01-12 DIAGNOSIS — H353222 Exudative age-related macular degeneration, left eye, with inactive choroidal neovascularization: Secondary | ICD-10-CM | POA: Diagnosis not present

## 2019-01-16 DIAGNOSIS — Z743 Need for continuous supervision: Secondary | ICD-10-CM | POA: Diagnosis not present

## 2019-01-16 DIAGNOSIS — Z880 Allergy status to penicillin: Secondary | ICD-10-CM | POA: Diagnosis not present

## 2019-01-16 DIAGNOSIS — Z79890 Hormone replacement therapy (postmenopausal): Secondary | ICD-10-CM | POA: Diagnosis not present

## 2019-01-16 DIAGNOSIS — M549 Dorsalgia, unspecified: Secondary | ICD-10-CM | POA: Diagnosis not present

## 2019-01-16 DIAGNOSIS — R079 Chest pain, unspecified: Secondary | ICD-10-CM | POA: Diagnosis not present

## 2019-01-16 DIAGNOSIS — M5489 Other dorsalgia: Secondary | ICD-10-CM | POA: Diagnosis not present

## 2019-01-16 DIAGNOSIS — I10 Essential (primary) hypertension: Secondary | ICD-10-CM | POA: Diagnosis not present

## 2019-01-16 DIAGNOSIS — R0602 Shortness of breath: Secondary | ICD-10-CM | POA: Diagnosis not present

## 2019-01-16 DIAGNOSIS — Z882 Allergy status to sulfonamides status: Secondary | ICD-10-CM | POA: Diagnosis not present

## 2019-01-16 DIAGNOSIS — M810 Age-related osteoporosis without current pathological fracture: Secondary | ICD-10-CM | POA: Diagnosis not present

## 2019-01-16 DIAGNOSIS — Z888 Allergy status to other drugs, medicaments and biological substances status: Secondary | ICD-10-CM | POA: Diagnosis not present

## 2019-01-16 DIAGNOSIS — R9431 Abnormal electrocardiogram [ECG] [EKG]: Secondary | ICD-10-CM | POA: Diagnosis not present

## 2019-01-16 DIAGNOSIS — E079 Disorder of thyroid, unspecified: Secondary | ICD-10-CM | POA: Diagnosis not present

## 2019-01-16 DIAGNOSIS — R42 Dizziness and giddiness: Secondary | ICD-10-CM | POA: Diagnosis not present

## 2019-01-16 DIAGNOSIS — I447 Left bundle-branch block, unspecified: Secondary | ICD-10-CM | POA: Diagnosis not present

## 2019-01-16 DIAGNOSIS — R0789 Other chest pain: Secondary | ICD-10-CM | POA: Diagnosis not present

## 2019-01-18 ENCOUNTER — Encounter: Payer: Self-pay | Admitting: Family Medicine

## 2019-01-18 ENCOUNTER — Ambulatory Visit (INDEPENDENT_AMBULATORY_CARE_PROVIDER_SITE_OTHER): Payer: Medicare Other | Admitting: Family Medicine

## 2019-01-18 ENCOUNTER — Telehealth: Payer: Self-pay

## 2019-01-18 DIAGNOSIS — I10 Essential (primary) hypertension: Secondary | ICD-10-CM | POA: Diagnosis not present

## 2019-01-18 DIAGNOSIS — M546 Pain in thoracic spine: Secondary | ICD-10-CM | POA: Diagnosis not present

## 2019-01-18 MED ORDER — AMLODIPINE BESYLATE 2.5 MG PO TABS: 10.0000 mg | ORAL_TABLET | Freq: Every day | ORAL | 3 refills | Status: DC

## 2019-01-18 MED ORDER — CYCLOBENZAPRINE HCL 5 MG PO TABS: 2.5000 mg | ORAL_TABLET | Freq: Three times a day (TID) | ORAL | 0 refills | Status: DC | PRN

## 2019-01-18 NOTE — Telephone Encounter (Signed)
Attempted to contact patient, no answer or voicemail. 

## 2019-01-18 NOTE — Progress Notes (Signed)
Patient: Megan Woods Female    DOB: 05-17-1935   84 y.o.   MRN: [id number] Visit Date: 01/18/2019  Today's Provider: Lavon Paganini, MD   No chief complaint on file.  Subjective:    Virtual Visit via Telephone Note  I connected with Jennye Moccasin on 01/18/19 at  3:20 PM EDT by telephone and verified that I am speaking with the correct person using two identifiers.   Patient location: home Provider location: [place] involved in the visit: patient, provider     I discussed the limitations, risks, security and privacy concerns of performing an evaluation and management service by telephone and the availability of in person appointments. I also discussed with the patient that there may be a patient responsible charge related to this service. The patient expressed understanding and agreed to proceed.  Patient unable to connect to video enabled call  HPI   Patient started having back and left arm pain with elevated blood pressure about 1 week ago.  On 5/11, her blood pressure was worse and she was having chest pain, so she went to an urgent care as she is out of town.  She was sent from the urgent care to the emergency department.  At the emergency department, her blood pressure was 180/89 and had decreased to 160/88 before discharge.  She states she was worked up with an EKG, lab works, x-rays and all was benign.  Her chest pain has resolved, but she continues to have a dull ache in her thoracic back under her left shoulder blade ongoing for the last week.  She has not noticed that this is any worse with movement of her arm.  She finds that it is better when she is laying down and more uncomfortable when she is standing.  No pain with deep breathing and no shortness of breath.  Blood pressure remains elevated and was 156/80 this morning when she checked it.  She is taking her amlodipine 7.5 mg daily with good compliance.  She denies any recent  over-the-counter medications or changes in medications.  She is urinating fine   Allergies  Allergen Reactions  . Chocolate     Other reaction(s): other/intolerance  . Iodine     Other reaction(s): mild rash/itching  . Penicillins Swelling    Other reaction(s): mild rash/itching  . Shellfish Allergy     Other reaction(s): other/intolerance "I pass out"  . Sulfur     Other reaction(s): mild rash/itching     Current Outpatient Medications:  .  amLODipine (NORVASC) 2.5 MG tablet, Take 3 tablets (7.5 mg total) by mouth daily., Disp: 270 tablet, Rfl: 3 .  Calcium Carbonate (CALCIUM 600 PO), Take 600 mg by mouth 2 (two) times daily., Disp: , Rfl:  .  Cholecalciferol (VITAMIN D PO), Take 1,000 mg by mouth daily., Disp: , Rfl:  .  estradiol (VIVELLE-DOT) 0.0375 MG/24HR, Place 1 patch onto the skin 2 (two) times a week. , Disp: , Rfl:  .  levothyroxine (SYNTHROID, LEVOTHROID) 100 MCG tablet, Take 1 tablet (100 mcg total) by mouth daily before breakfast. Brand name only, Disp: 90 tablet, Rfl: 3  Review of Systems  Social History   Tobacco Use  . Smoking status: Never Smoker  . Smokeless tobacco: Never Used  Substance Use Topics  . Alcohol use: Never    Frequency: Never      Objective:   There were no vitals taken for this visit. There were no vitals  filed for this visit.   Physical Exam      Assessment & Plan      I discussed the assessment and treatment plan with the patient. The patient was provided an opportunity to ask questions and all were answered. The patient agreed with the plan and demonstrated an understanding of the instructions.   The patient was advised to call back or seek an in-person evaluation if the symptoms worsen or if the condition fails to improve as anticipated.  Problem List Items Addressed This Visit      Cardiovascular and Mediastinum   Essential hypertension - Primary    Chronic, but uncontrolled Patient has been hesitant to take  medications in the past She is tolerating amlodipine well, so we will increase to 10 mg daily Discussed that she may need more medication than this in the future Records have not been sent, but encouraged her to request these from her recent emergency department visit where she had lab work done [facility] that organ damage from accelerated hypertension was ruled out in the ED      Relevant Medications   amLODipine (NORVASC) 2.5 MG tablet     Other   Acute left-sided thoracic back pain    New problem No further chest pain Cardiac etiologies were ruled out in the emergency department Seems that she also had a chest x-ray which ruled out any pneumonia or shoulder pathology I reassured her about these things and I suspect that it is a muscular pain I will give her low-dose of Flexeril that she can use with caution to see if this may be a spasm Discussed strict return precautions      Relevant Medications   cyclobenzaprine (FLEXERIL) 5 MG tablet       Return in about 4 weeks (around 02/15/2019) for BP, hypothyroid f/u.   The entirety of the information documented in the History of Present Illness, Review of Systems and Physical Exam were personally obtained by me. Portions of this information were initially documented by Tiburcio Pea, CMA and reviewed by me for thoroughness and accuracy.    Virginia Crews, MD, MPH [facility] 01/20/2019 9:41 AM

## 2019-01-18 NOTE — Telephone Encounter (Signed)
Please schedule an evisit with her for ER f/u visit.  If we can get an ROI to send and request those records, that would be ideal.  Also please try to review medication list.  We may be able to adjust some of her BP meds.  If she is having any slurred speech, vision loss, weakness/numbness on one side of the body, facial droop, or other signs of stroke, she needs to go emergently to ER.

## 2019-01-18 NOTE — Telephone Encounter (Signed)
Patients daughter Ferdinand Lango called requesting that this message be sent to Dr. Jacinto Reap. Weyman Croon says that her mom is currently in [place] and was hospitalized and recently discharged on 01/16/2019. Patient's blood pressure was extremely high (180-190/80) and they were ruling out stroke. Weyman Croon says that  The hospital in [place] gave patient 3 Asprin and sent her home. The hospital was supposed to have sent over hospital records for Dr. B to review. Weyman Croon says that patients blood pressure remains high and she is concerned about her mom having a stroke. She states that her mom didn't want to call [facility] because she didn't want to be a bother. Patient is still in [place], and [place] be back in [place] until 02/02/2019. Weyman Croon is asking if someone will please reach out to the patient and talk to her to let her know what she should do. Patient's call back is [phone]. Erling Cruz is unsure what medications or dose she is taking. Please Advise.

## 2019-01-18 NOTE — Patient Instructions (Signed)
Muscle Cramps and Spasms Muscle cramps and spasms are when muscles tighten by themselves. They usually get better within minutes. Muscle cramps are painful. They are usually stronger and last longer than muscle spasms. Muscle spasms may or may not be painful. They can last a few seconds or much longer. Cramps and spasms can affect any muscle, but they occur most often in the calf muscles of the leg. They are usually not caused by a serious problem. In many cases, the cause is not known. Some common causes include:  Doing more physical work or exercise than your body is ready for.  Using the muscles too much (overuse) by repeating certain movements too many times.  Staying in a certain position for a long time.  Playing a sport or doing an activity without preparing properly.  Using bad form or technique while playing a sport or doing an activity.  Not having enough water in your body (dehydration).  Injury.  Side effects of some medicines.  Low levels of the salts and minerals in your blood (electrolytes), such as low potassium or calcium. Follow these instructions at home: Managing pain and stiffness      Massage, stretch, and relax the muscle. Do this for many minutes at a time.  If told, put heat on tight or tense muscles as often as told by your doctor. Use the heat source that your doctor recommends, such as a moist heat pack or a heating pad. ? Place a towel between your skin and the heat source. ? Leave the heat on for 20-30 minutes. ? Remove the heat if your skin turns bright red. This is very important if you are not able to feel pain, heat, or cold. You may have a greater risk of getting burned.  If told, put ice on the affected area. This may help if you are sore or have pain after a cramp or spasm. ? Put ice in a plastic bag. ? Place a towel between your skin and the bag. ? Leave the ice on for 20 minutes, 2-3 times a day.  Try taking hot showers or baths to help  relax tight muscles. Eating and drinking  Drink enough fluid to keep your pee (urine) pale yellow.  Eat a healthy diet to help ensure that your muscles work well. This should include: ? Fruits and vegetables. ? Lean protein. ? Whole grains. ? Low-fat or nonfat dairy products. General instructions  If you are having cramps often, avoid intense exercise for several days.  Take over-the-counter and prescription medicines only as told by your doctor.  Watch for any changes in your symptoms.  Keep all follow-up visits as told by your doctor. This is important. Contact a doctor if:  Your cramps or spasms get worse or happen more often.  Your cramps or spasms do not get better with time. Summary  Muscle cramps and spasms are when muscles tighten by themselves. They usually get better within minutes.  Cramps and spasms occur most often in the calf muscles of the leg.  Massage, stretch, and relax the muscle. This may help the cramp or spasm go away.  Drink enough fluid to keep your pee (urine) pale yellow. This information is not intended to replace advice given to you by your health care provider. Make sure you discuss any questions you have with your health care provider. Document Released: 08/06/2008 Document Revised: 01/17/2018 Document Reviewed: 01/17/2018 Elsevier Interactive Patient Education  2019 Elsevier Inc.  

## 2019-01-20 DIAGNOSIS — M546 Pain in thoracic spine: Secondary | ICD-10-CM | POA: Insufficient documentation

## 2019-01-20 NOTE — Assessment & Plan Note (Signed)
Chronic, but uncontrolled Patient has been hesitant to take medications in the past She is tolerating amlodipine well, so we will increase to 10 mg daily Discussed that she may need more medication than this in the future Records have not been sent, but encouraged her to request these from her recent emergency department visit where she had lab work done Seems that organ damage from accelerated hypertension was ruled out in the ED

## 2019-01-20 NOTE — Telephone Encounter (Signed)
Attempted to contact patient, no answer or voicemail. 

## 2019-01-20 NOTE — Assessment & Plan Note (Signed)
New problem No further chest pain Cardiac etiologies were ruled out in the emergency department Seems that she also had a chest x-ray which ruled out any pneumonia or shoulder pathology I reassured her about these things and I suspect that it is a muscular pain I will give her low-dose of Flexeril that she can use with caution to see if this may be a spasm Discussed strict return precautions

## 2019-02-06 LAB — RBC: RBC: 4.66 (ref 3.87–5.11)

## 2019-02-06 LAB — TROPONIN I: Troponin I: 0.012

## 2019-02-06 LAB — CMP14 + ANION GAP: Anion gap: 5

## 2019-02-06 LAB — AST

## 2019-02-06 LAB — SODIUM
BUN/Creatinine Ratio: 32.3
Potassium: 4

## 2019-02-06 LAB — PLATELET COUNT: Platelets: 265

## 2019-02-06 LAB — BUN AND CREATININE (CC13)
BUN: 20 (ref 4–21)
Creat: 0.62

## 2019-02-06 LAB — GLUCOSE 16585: Glucose: 124

## 2019-02-06 LAB — WBC: WBC: 7.2

## 2019-02-06 LAB — ALKALINE PHOSPHATASE: Alkaline Phosphatase: 71

## 2019-02-06 LAB — ALT (SGPT) PICCOLO, WAIVED: ALT: 13 (ref 3–30)

## 2019-02-06 LAB — CHLORIDE: Chloride: 108

## 2019-02-06 LAB — NEUTROPHIL AB TEST LEVEL 1: Neutrophils: 4.6

## 2019-02-06 LAB — ANTIEXTRACTABLE NUCLEAR AG: Albumin/Globulin Ratio: 1.6

## 2019-02-06 LAB — PROTIME-INR
INR: 0.9 (ref 0.9–1.1)
PT: 12

## 2019-02-06 LAB — ALBUMIN: Albumin: 4.2

## 2019-02-06 LAB — PROTEIN, TOTAL: Total Protein: 6.8 g/dL

## 2019-02-06 LAB — HEMATOCRIT: HCT: 43 — AB (ref 29–41)

## 2019-02-06 LAB — HEMOGLOBIN: Hemoglobin: 14.6

## 2019-02-06 LAB — CALCIUM: Calcium: 9.3

## 2019-02-06 LAB — CO2, TOTAL: CO2: 26

## 2019-02-15 ENCOUNTER — Other Ambulatory Visit: Payer: Self-pay

## 2019-02-15 MED ORDER — AMLODIPINE BESYLATE 10 MG PO TABS: 10.0000 mg | ORAL_TABLET | Freq: Every day | ORAL | 1 refills | Status: DC

## 2019-02-15 NOTE — Telephone Encounter (Signed)
Patient requesting refill on the following medication: amLODipine (NORVASC) 2.5 MG tablet   Pharmacy:[facility] [place].

## 2019-02-16 ENCOUNTER — Ambulatory Visit: Payer: Medicare Other | Admitting: Family Medicine

## 2019-02-16 NOTE — Progress Notes (Signed)
Patient: Megan Woods Female    DOB: 10/02/1934   84 y.o.   MRN: [id number] Visit Date: 02/17/2019  Today's Provider: Lavon Paganini, MD   Chief Complaint  Patient presents with   Hypertension   Hypothyroidism   Subjective:    Virtual Visit via Telephone Note  I connected with Jennye Moccasin on 02/17/19 at  8:20 AM EDT by telephone and verified that I am speaking with the correct person using two identifiers.  Patient location: home Provider location: [place] involved in the visit: patient, provider   I discussed the limitations, risks, security and privacy concerns of performing an evaluation and management service by telephone and the availability of in person appointments. I also discussed with the patient that there may be a patient responsible charge related to this service. The patient expressed understanding and agreed to proceed.  HPI  Hypertension, follow-up:  BP Readings from Last 3 Encounters:  08/12/18 (!) 147/75  07/08/18 (!) 174/89    She was last seen for hypertension 1 months ago.  BP at that visit was 147/75. Management changes since that visit include increased Amlodipine to 10 mg. She reports good compliance with treatment. She is not having side effects.  She is exercising. She is adherent to low salt diet.   Outside blood pressures are being checked.  . She is experiencing none.  Patient denies chest pain, chest pressure/discomfort, claudication, dyspnea, exertional chest pressure/discomfort, fatigue, irregular heart beat, lower extremity edema, near-syncope, orthopnea, palpitations, paroxysmal nocturnal dyspnea, syncope and tachypnea.   Cardiovascular risk factors include advanced age (older than 66 for men, 4 for women) and hypertension.  Use of agents associated with hypertension: thyroid hormones.     Weight trend: stable Wt Readings from Last 3 Encounters:  08/12/18 123 lb 3.2 oz (55.9 kg)  07/08/18 122  lb 6.4 oz (55.5 kg)    Current diet: in general, a "healthy" diet    ------------------------------------------------------------------------  Hypothyroid, follow-up:  TSH  Date Value Ref Range Status  08/12/2018 1.800 0.450 - 4.500 uIU/mL Final  04/05/2018 0.16 (A) 0.41 - 5.90 Final  06/16/2017 3.95 0.41 - 5.90 Final   Wt Readings from Last 3 Encounters:  08/12/18 123 lb 3.2 oz (55.9 kg)  07/08/18 122 lb 6.4 oz (55.5 kg)    She was last seen for hypothyroid 6 months ago.  Management since that visit includes no change. She reports good compliance with treatment. She is not having side effects.  She is exercising. She is experiencing none She denies change in energy level, diarrhea, heat / cold intolerance, nervousness, palpitations and weight changes Weight trend: stable  ------------------------------------------------------------------------  Allergies  Allergen Reactions   Chocolate     Other reaction(s): other/intolerance   Iodine     Other reaction(s): mild rash/itching   Penicillins Swelling    Other reaction(s): mild rash/itching   Shellfish Allergy     Other reaction(s): other/intolerance "I pass out"   Sulfur     Other reaction(s): mild rash/itching     Current Outpatient Medications:    amLODipine (NORVASC) 10 MG tablet, Take 1 tablet (10 mg total) by mouth daily., Disp: 90 tablet, Rfl: 1   Calcium Carbonate (CALCIUM 600 PO), Take 600 mg by mouth 2 (two) times daily., Disp: , Rfl:    Cholecalciferol (VITAMIN D PO), Take 1,000 mg by mouth daily., Disp: , Rfl:    cyclobenzaprine (FLEXERIL) 5 MG tablet, Take 0.5-1 tablets (2.5-5 mg total) by mouth  3 (three) times daily as needed for muscle spasms., Disp: 30 tablet, Rfl: 0   estradiol (VIVELLE-DOT) 0.0375 MG/24HR, Place 1 patch onto the skin 2 (two) times a week. , Disp: , Rfl:    levothyroxine (SYNTHROID, LEVOTHROID) 100 MCG tablet, Take 1 tablet (100 mcg total) by mouth daily before breakfast.  Brand name only, Disp: 90 tablet, Rfl: 3  Review of Systems  Constitutional: Negative.   Respiratory: Negative.   Cardiovascular: Negative.   Musculoskeletal: Negative.     Social History   Tobacco Use   Smoking status: Never Smoker   Smokeless tobacco: Never Used  Substance Use Topics   Alcohol use: Never    Frequency: Never      Objective:   There were no vitals taken for this visit. There were no vitals filed for this visit.   Physical Exam      Assessment & Plan    I discussed the assessment and treatment plan with the patient. The patient was provided an opportunity to ask questions and all were answered. The patient agreed with the plan and demonstrated an understanding of the instructions.   The patient was advised to call back or seek an in-person evaluation if the symptoms worsen or if the condition fails to improve as anticipated.  Problem List Items Addressed This Visit      Cardiovascular and Mediastinum   Essential hypertension    Chronic and well-controlled She is tolerating amlodipine well, and we will continue 10 mg daily Reviewed recent metabolic panel Reviewed her records from her recent emergency department visit        Endocrine   Hypothyroidism - Primary    Patient is asymptomatic Continue Synthroid at current dose Recheck TSH and consider dose titration if needed      Relevant Orders   TSH       Return in about 6 months (around 08/19/2019) for chronic disease f/u.   The entirety of the information documented in the History of Present Illness, Review of Systems and Physical Exam were personally obtained by me. Portions of this information were initially documented by Tiburcio Pea, CMA and reviewed by me for thoroughness and accuracy.    Sariyah Corcino, Dionne Bucy, MD MPH [facility]

## 2019-02-17 ENCOUNTER — Ambulatory Visit (INDEPENDENT_AMBULATORY_CARE_PROVIDER_SITE_OTHER): Payer: Medicare Other | Admitting: Family Medicine

## 2019-02-17 ENCOUNTER — Encounter: Payer: Self-pay | Admitting: Family Medicine

## 2019-02-17 VITALS — BP 126/70

## 2019-02-17 DIAGNOSIS — E039 Hypothyroidism, unspecified: Secondary | ICD-10-CM

## 2019-02-17 DIAGNOSIS — I10 Essential (primary) hypertension: Secondary | ICD-10-CM

## 2019-02-17 NOTE — Assessment & Plan Note (Signed)
Chronic and well-controlled She is tolerating amlodipine well, and we will continue 10 mg daily Reviewed recent metabolic panel Reviewed her records from her recent emergency department visit

## 2019-02-17 NOTE — Assessment & Plan Note (Signed)
Patient is asymptomatic Continue Synthroid at current dose Recheck TSH and consider dose titration if needed

## 2019-02-18 LAB — TSH: TSH: 2.11 u[IU]/mL (ref 0.450–4.500)

## 2019-02-20 ENCOUNTER — Telehealth: Payer: Self-pay

## 2019-02-20 NOTE — Telephone Encounter (Signed)
-----   Message from Trinna Post, [place] sent at 02/20/2019  3:17 PM EDT ----- TSH stable, continue current dose of thyroid medication.

## 2019-02-20 NOTE — Telephone Encounter (Signed)
Patient advised as directed below. 

## 2019-04-07 DIAGNOSIS — Z1231 Encounter for screening mammogram for malignant neoplasm of breast: Secondary | ICD-10-CM | POA: Diagnosis not present

## 2019-04-07 DIAGNOSIS — Z9289 Personal history of other medical treatment: Secondary | ICD-10-CM | POA: Diagnosis not present

## 2019-04-24 DIAGNOSIS — N6489 Other specified disorders of breast: Secondary | ICD-10-CM | POA: Diagnosis not present

## 2019-04-24 DIAGNOSIS — R928 Other abnormal and inconclusive findings on diagnostic imaging of breast: Secondary | ICD-10-CM | POA: Diagnosis not present

## 2019-04-24 LAB — HM MAMMOGRAPHY

## 2019-05-02 ENCOUNTER — Encounter: Payer: Self-pay | Admitting: Family Medicine

## 2019-05-02 ENCOUNTER — Other Ambulatory Visit: Payer: Self-pay | Admitting: Family Medicine

## 2019-05-02 MED ORDER — HYDROCHLOROTHIAZIDE 12.5 MG PO TABS: 12.5000 mg | ORAL_TABLET | Freq: Every day | ORAL | 3 refills | Status: DC

## 2019-05-02 NOTE — Progress Notes (Signed)
Patient reports leg swelling since increasing amlodipine dose.  We discussed and will d/c amlodipine and start HCTZ 12.5. Repeat BMP at next visit.  Advised to call if BP uncontrolled on home readings after switching.

## 2019-05-04 DIAGNOSIS — H353132 Nonexudative age-related macular degeneration, bilateral, intermediate dry stage: Secondary | ICD-10-CM | POA: Diagnosis not present

## 2019-08-24 ENCOUNTER — Telehealth: Payer: Self-pay

## 2019-08-24 NOTE — Telephone Encounter (Signed)
[facility] is letting the patient know that her appointment is on the 22 of December and not the 18 as she thinks   Copied from [place] [phone]. Topic: Appointment Scheduling - Scheduling Inquiry for Clinic >> Aug 24, 2019  2:45 PM Greggory Keen D wrote: Reason for CRM: pt called saying when the automated call system called to remind her of her appt she cancelled her appt by mistake.  She is traveling from [facility]  to see Dr. Jacinto Reap.  She does not wan there appt cancelled.  She does want to see Dr. Jacinto Reap tomorrow.  The system will updats after 12 am and the appt will need to be put back in the system.

## 2019-08-29 ENCOUNTER — Other Ambulatory Visit: Payer: Self-pay | Admitting: Family Medicine

## 2019-08-29 ENCOUNTER — Encounter: Payer: Self-pay | Admitting: Family Medicine

## 2019-08-29 ENCOUNTER — Ambulatory Visit: Payer: Medicare Other | Admitting: Family Medicine

## 2019-08-29 ENCOUNTER — Ambulatory Visit (INDEPENDENT_AMBULATORY_CARE_PROVIDER_SITE_OTHER): Payer: Medicare Other | Admitting: Family Medicine

## 2019-08-29 ENCOUNTER — Other Ambulatory Visit: Payer: Self-pay

## 2019-08-29 VITALS — BP 135/78 | HR 73 | Temp 96.9°F | Resp 16 | Ht 65.0 in | Wt 122.0 lb

## 2019-08-29 DIAGNOSIS — I10 Essential (primary) hypertension: Secondary | ICD-10-CM

## 2019-08-29 DIAGNOSIS — E039 Hypothyroidism, unspecified: Secondary | ICD-10-CM

## 2019-08-29 DIAGNOSIS — Z13220 Encounter for screening for lipoid disorders: Secondary | ICD-10-CM

## 2019-08-29 MED ORDER — CIPROFLOXACIN HCL 500 MG PO TABS: 500.0000 mg | ORAL_TABLET | Freq: Two times a day (BID) | ORAL | 0 refills | Status: AC

## 2019-08-29 MED ORDER — HYDROCHLOROTHIAZIDE 12.5 MG PO TABS: 12.5000 mg | ORAL_TABLET | Freq: Every day | ORAL | 3 refills | Status: DC

## 2019-08-29 NOTE — Progress Notes (Signed)
Patient: Megan Woods Female    DOB: [birth year]   84 y.o.   MRN: [id number] Visit Date: 08/29/2019  Today's Provider: Lavon Paganini, MD   Chief Complaint  Patient presents with  . Hypertension  . Hypothyroidism   Subjective:    I [place] S. Dimas, CMA, am acting as scribe for Lavon Paganini, MD.   HPI  Hypertension, follow-up:  BP Readings from Last 3 Encounters:  08/29/19 135/78  02/17/19 126/70  08/12/18 (!) 147/75    She was last seen for hypertension 6 months ago.  BP at that visit was 126/70. Management changes since that visit include on 05/02/2019 Amlodipine was discontinued and hydrochlorothiazide 12.5mg  was started. She reports excellent compliance with treatment. She is not having side effects.  She is exercising. She is adherent to low salt diet.   Outside blood pressures are elevated. She is experiencing none.  Patient denies chest pain and lower extremity edema.   Cardiovascular risk factors include advanced age (older than 34 for men, 86 for women) and hypertension.  Use of agents associated with hypertension: none.     Weight trend: stable Wt Readings from Last 3 Encounters:  08/29/19 122 lb (55.3 kg)  08/12/18 123 lb 3.2 oz (55.9 kg)  07/08/18 122 lb 6.4 oz (55.5 kg)    Current diet: in general, a "healthy" diet    ------------------------------------------------------------------------   Hypothyroid, follow-up:  TSH  Date Value Ref Range Status  02/17/2019 2.110 0.450 - 4.500 uIU/mL Final  08/12/2018 1.800 0.450 - 4.500 uIU/mL Final  04/05/2018 0.16 (A) 0.41 - 5.90 Final   Wt Readings from Last 3 Encounters:  08/29/19 122 lb (55.3 kg)  08/12/18 123 lb 3.2 oz (55.9 kg)  07/08/18 122 lb 6.4 oz (55.5 kg)    She was last seen for hypothyroid 6 months ago.  Management since that visit includes no changes. She reports excellent compliance with treatment. She is not having side effects.  She is exercising. She is  experiencing none She denies change in energy level, diarrhea, heat / cold intolerance, nervousness, palpitations and weight changes Weight trend: stable  ------------------------------------------------------------------------   Allergies  Allergen Reactions  . Chocolate     Other reaction(s): other/intolerance  . Iodine     Other reaction(s): mild rash/itching  . Penicillins Swelling    Other reaction(s): mild rash/itching  . Shellfish Allergy     Other reaction(s): other/intolerance "I pass out"  . Sulfur     Other reaction(s): mild rash/itching     Current Outpatient Medications:  .  Calcium Carbonate (CALCIUM 600 PO), Take 600 mg by mouth 2 (two) times daily., Disp: , Rfl:  .  Cholecalciferol (VITAMIN D PO), Take 1,000 mg by mouth daily., Disp: , Rfl:  .  estradiol (VIVELLE-DOT) 0.0375 MG/24HR, Place 1 patch onto the skin 2 (two) times a week. , Disp: , Rfl:  .  hydrochlorothiazide (HYDRODIURIL) 12.5 MG tablet, Take 1 tablet (12.5 mg total) by mouth daily., Disp: 30 tablet, Rfl: 3 .  levothyroxine (SYNTHROID, LEVOTHROID) 100 MCG tablet, Take 1 tablet (100 mcg total) by mouth daily before breakfast. Brand name only, Disp: 90 tablet, Rfl: 3  Review of Systems  Constitutional: Negative.   Respiratory: Negative.   Cardiovascular: Negative.   Endocrine: Negative.     Social History   Tobacco Use  . Smoking status: Never Smoker  . Smokeless tobacco: Never Used  Substance Use Topics  . Alcohol use: Never  Objective:   BP 135/78 (BP Location: Left Arm, Patient Position: Sitting, Cuff Size: Normal)   Pulse 73   Temp (!) 96.9 F (36.1 C) (Temporal)   Resp 16   Ht 5\' 5"  (1.651 m)   Wt 122 lb (55.3 kg)   BMI 20.30 kg/m  Vitals:   08/29/19 1135  BP: 135/78  Pulse: 73  Resp: 16  Temp: (!) 96.9 F (36.1 C)  TempSrc: Temporal  Weight: 122 lb (55.3 kg)  Height: 5\' 5"  (1.651 m)  Body mass index is 20.3 kg/m.   Physical Exam Vitals reviewed.    Constitutional:      General: She is not in acute distress.    Appearance: Normal appearance. She is well-developed. She is not diaphoretic.  HENT:     Head: Normocephalic and atraumatic.  Eyes:     General: No scleral icterus.    Conjunctiva/sclera: Conjunctivae normal.  Neck:     Thyroid: No thyromegaly.  Cardiovascular:     Rate and Rhythm: Normal rate and regular rhythm.     Pulses: Normal pulses.     Heart sounds: Normal heart sounds. No murmur.  Pulmonary:     Effort: Pulmonary effort is normal. No respiratory distress.     Breath sounds: Normal breath sounds. No wheezing, rhonchi or rales.  Musculoskeletal:     Cervical back: Neck supple.     Right lower leg: No edema.     Left lower leg: No edema.  Lymphadenopathy:     Cervical: No cervical adenopathy.  Skin:    General: Skin is warm and dry.     Capillary Refill: Capillary refill takes less than 2 seconds.     Findings: No rash.  Neurological:     Mental Status: She is alert and oriented to person, place, and time. Mental status is at baseline.  Psychiatric:        Mood and Affect: Mood normal.        Behavior: Behavior normal.      No results found for any visits on 08/29/19.     Assessment & Plan    Problem List Items Addressed This Visit      Cardiovascular and Mediastinum   Essential hypertension    Well controlled Continue current medications Recheck metabolic panel F/u in 6 months       Relevant Medications   hydrochlorothiazide (HYDRODIURIL) 12.5 MG tablet   Other Relevant Orders   CMP (Comprehensive metabolic panel)   Lipid panel     Endocrine   Hypothyroidism - Primary    Chronic and stable Asymptomatic Continue Synthroid at current dose Recheck TSH and consider dose titration as needed      Relevant Orders   TSH    Other Visit Diagnoses    Screening, lipid       Relevant Orders   CMP (Comprehensive metabolic panel)   Lipid panel       Return in about 6 months (around  02/27/2020) for [place].   The entirety of the information documented in the History of Present Illness, Review of Systems and Physical Exam were personally obtained by me. Portions of this information were initially documented by Lynford Humphrey, CMA and reviewed by me for thoroughness and accuracy.    Dougles Kimmey, Dionne Bucy, MD MPH [facility]

## 2019-08-29 NOTE — Patient Instructions (Signed)

## 2019-08-29 NOTE — Addendum Note (Signed)
Addended by: Virginia Crews on: 08/29/2019 12:08 PM   Modules accepted: Orders

## 2019-08-29 NOTE — Assessment & Plan Note (Signed)
Well controlled Continue current medications Recheck metabolic panel F/u in 6 months  

## 2019-08-29 NOTE — Assessment & Plan Note (Signed)
Chronic and stable Asymptomatic Continue Synthroid at current dose Recheck TSH and consider dose titration as needed

## 2019-08-30 ENCOUNTER — Ambulatory Visit: Payer: Self-pay | Admitting: *Deleted

## 2019-08-30 ENCOUNTER — Telehealth: Payer: Self-pay

## 2019-08-30 ENCOUNTER — Ambulatory Visit: Payer: Self-pay

## 2019-08-30 ENCOUNTER — Other Ambulatory Visit: Payer: Self-pay | Admitting: Family Medicine

## 2019-08-30 LAB — LIPID PANEL
Chol/HDL Ratio: 2.3 ratio (ref 0.0–4.4)
Cholesterol, Total: 185 mg/dL (ref 100–199)
HDL: 82 mg/dL (ref >39)
LDL Chol Calc (NIH): 92 mg/dL (ref 0–99)
Triglycerides: 55 mg/dL (ref 0–149)
VLDL Cholesterol Cal: 11 mg/dL (ref 5–40)

## 2019-08-30 LAB — COMPREHENSIVE METABOLIC PANEL
ALT: 13 IU/L (ref 0–32)
AST: 19 IU/L (ref 0–40)
Albumin/Globulin Ratio: 2.6 — ABNORMAL HIGH (ref 1.2–2.2)
Albumin: 4.1 g/dL (ref 3.6–4.6)
Alkaline Phosphatase: 73 IU/L (ref 39–117)
BUN/Creatinine Ratio: 17 (ref 12–28)
BUN: 14 mg/dL (ref 8–27)
Bilirubin Total: 0.4 mg/dL (ref 0.0–1.2)
CO2: 23 mmol/L (ref 20–29)
Calcium: 9.5 mg/dL (ref 8.7–10.3)
Chloride: 103 mmol/L (ref 96–106)
Creatinine, Ser: 0.84 mg/dL (ref 0.57–1.00)
GFR calc Af Amer: 75 mL/min/{1.73_m2} (ref >59)
GFR calc non Af Amer: 65 mL/min/{1.73_m2} (ref >59)
Globulin, Total: 1.6 g/dL (ref 1.5–4.5)
Glucose: 81 mg/dL (ref 65–99)
Potassium: 4.2 mmol/L (ref 3.5–5.2)
Sodium: 141 mmol/L (ref 134–144)
Total Protein: 5.7 g/dL — ABNORMAL LOW (ref 6.0–8.5)

## 2019-08-30 LAB — TSH: TSH: 0.747 u[IU]/mL (ref 0.450–4.500)

## 2019-08-30 NOTE — Telephone Encounter (Signed)
Called pt. And she reports she has already spoken with a triage nurse. See note.

## 2019-08-30 NOTE — Telephone Encounter (Signed)
Medication Refill - Medication: levothyroxine (SYNTHROID, LEVOTHROID) 100 MCG tablet   Pt is out of medication  Has the patient contacted their pharmacy? Yes.   (Agent: If no, request that the patient contact the pharmacy for the refill.) (Agent: If yes, when and what did the pharmacy advise?)  Preferred Pharmacy (with phone number or street name): [place], [place] Phone:  [phone]  Fax:  [phone]       Agent: Please be advised that RX refills may take up to 3 business days. We ask that you follow-up with your pharmacy.

## 2019-08-30 NOTE — Telephone Encounter (Signed)
See documentation below.   Megan Woods, daughter called in with questions regarding COVID-19 testing for her mother, father and herself.    Reason for Disposition . COVID-19 Testing, questions about  Answer Assessment - Initial Assessment Questions 1. COVID-19 DIAGNOSIS: "Who made your Coronavirus (COVID-19) diagnosis?" "Was it confirmed by a positive lab test?" If not diagnosed by a HCP, ask "Are there lots of cases (community spread) where you live?" (See public health department website, if unsure)   The daughter Megan Woods called in with questions pertaining to COVID-19.    Her parents are staying at [facility] house for the holidays.   Parents are visiting from [place].    Megan Woods's  sister tested positive.  Not in the house with Megan Woods and her parents.  "My parents have not been around my sister who is positive but I have.      I've been around my sister.  I don't have any symptoms.  Megan Woods got tested on 08/28/2019 for COVID since her sister's test result came back positive for the virus.   Megan Woods does not have her result back.  Do my parents need to be tested?   I let her know she and her parents need to quarantine for 14 days from 08/26/2019 since that is the date Megan Woods was with her sister all day who is positive.   I let her know that would put her and her parents quarantined through Jan. 1, 2021.  Megan Woods was able to tell me the symptoms to look for.   I also advised her to check their and her temperature twice a day.    If any symptoms develop for them to seek medical care if  Any of them have shortness of breath or chest pressure/tightness.   I advised Megan Woods to clean the frequently touched surfaces in the house, wear masks around each other, wash hands frequently and social distance from each other even in the house.   I advised Megan Woods to isolate herself from the others as much as possible since she was exposed to her sister directly.     I answered Megan Woods's questions.    She verbalized understanding of my  instructions for herself and her parents.        2. COVID-19 EXPOSURE: "Was there any known exposure to COVID before the symptoms began?" CDC Definition of close contact: within 6 feet (2 meters) for a total of 15 minutes or more over a 24-hour period.      Megan Woods  spent all day Saturday with her  sister.    Sister got tested for COVID and her test came back positive for COVID.   Megan Woods, mother arrived Monday to [place] house along with her father.   They have not been directly exposed to the sister that is positive.     3. ONSET: "When did the COVID-19 symptoms start?"      No symptoms from [place] or parents. 4. WORST SYMPTOM: "What is your worst symptom?" (e.g., cough, fever, shortness of breath, muscle aches)     None 5. COUGH: "Do you have a cough?" If so, ask: "How bad is the cough?"       No 6. FEVER: "Do you have a fever?" If so, ask: "What is your temperature, how was it measured, and when did it start?"     No 7. RESPIRATORY STATUS: "Describe your breathing?" (e.g., shortness of breath, wheezing, unable to speak)      None 8. BETTER-SAME-WORSE: "Are you  getting better, staying the same or getting worse compared to yesterday?"  If getting worse, ask, "In what way?"     N/A 9. HIGH RISK DISEASE: "Do you have any chronic medical problems?" (e.g., asthma, heart or lung disease, weak immune system, obesity, etc.)     Megan Woods does not but her father does. 10. PREGNANCY: "Is there any chance you are pregnant?" "When was your last menstrual period?"       Not asked 11. OTHER SYMPTOMS: "Do you have any other symptoms?"  (e.g., chills, fatigue, headache, loss of smell or taste, muscle pain, sore throat; new loss of smell or taste especially support the diagnosis of COVID-19)       No symptoms from [place] or her parents.  Protocols used: CORONAVIRUS (COVID-19) DIAGNOSED OR SUSPECTED-A-AH

## 2019-08-30 NOTE — Telephone Encounter (Signed)
-----   Message from Mar Daring, [place] sent at 08/30/2019  1:01 PM EST ----- Kidney and liver function are normal. Sodium, potassium and calcium are normal. Protein is borderline low and may benefit from a protein drink like boost once daily between meals. Cholesterol is normal. Thyroid is normal.

## 2019-08-30 NOTE — Telephone Encounter (Signed)
Patient advised as below.  

## 2019-08-31 ENCOUNTER — Other Ambulatory Visit: Payer: Self-pay | Admitting: Family Medicine

## 2019-08-31 NOTE — Telephone Encounter (Signed)
Pt calling to see when this will be filled.  States she is completely out.  Pt would like a call once it has been filled.

## 2019-11-17 DIAGNOSIS — H353112 Nonexudative age-related macular degeneration, right eye, intermediate dry stage: Secondary | ICD-10-CM | POA: Diagnosis not present

## 2019-11-17 DIAGNOSIS — H353222 Exudative age-related macular degeneration, left eye, with inactive choroidal neovascularization: Secondary | ICD-10-CM | POA: Diagnosis not present

## 2019-11-28 DIAGNOSIS — S335XXD Sprain of ligaments of lumbar spine, subsequent encounter: Secondary | ICD-10-CM | POA: Diagnosis not present

## 2019-11-28 DIAGNOSIS — M9902 Segmental and somatic dysfunction of thoracic region: Secondary | ICD-10-CM | POA: Diagnosis not present

## 2019-11-28 DIAGNOSIS — M9903 Segmental and somatic dysfunction of lumbar region: Secondary | ICD-10-CM | POA: Diagnosis not present

## 2019-11-28 DIAGNOSIS — S233XXD Sprain of ligaments of thoracic spine, subsequent encounter: Secondary | ICD-10-CM | POA: Diagnosis not present

## 2019-11-30 DIAGNOSIS — M9902 Segmental and somatic dysfunction of thoracic region: Secondary | ICD-10-CM | POA: Diagnosis not present

## 2019-11-30 DIAGNOSIS — M9903 Segmental and somatic dysfunction of lumbar region: Secondary | ICD-10-CM | POA: Diagnosis not present

## 2019-11-30 DIAGNOSIS — S233XXD Sprain of ligaments of thoracic spine, subsequent encounter: Secondary | ICD-10-CM | POA: Diagnosis not present

## 2019-11-30 DIAGNOSIS — S335XXD Sprain of ligaments of lumbar spine, subsequent encounter: Secondary | ICD-10-CM | POA: Diagnosis not present

## 2019-12-04 DIAGNOSIS — M9903 Segmental and somatic dysfunction of lumbar region: Secondary | ICD-10-CM | POA: Diagnosis not present

## 2019-12-04 DIAGNOSIS — S233XXD Sprain of ligaments of thoracic spine, subsequent encounter: Secondary | ICD-10-CM | POA: Diagnosis not present

## 2019-12-04 DIAGNOSIS — M9902 Segmental and somatic dysfunction of thoracic region: Secondary | ICD-10-CM | POA: Diagnosis not present

## 2019-12-04 DIAGNOSIS — S335XXD Sprain of ligaments of lumbar spine, subsequent encounter: Secondary | ICD-10-CM | POA: Diagnosis not present

## 2019-12-05 DIAGNOSIS — Z7189 Other specified counseling: Secondary | ICD-10-CM | POA: Diagnosis not present

## 2019-12-05 DIAGNOSIS — D1801 Hemangioma of skin and subcutaneous tissue: Secondary | ICD-10-CM | POA: Diagnosis not present

## 2019-12-05 DIAGNOSIS — L814 Other melanin hyperpigmentation: Secondary | ICD-10-CM | POA: Diagnosis not present

## 2019-12-05 DIAGNOSIS — L821 Other seborrheic keratosis: Secondary | ICD-10-CM | POA: Diagnosis not present

## 2019-12-06 ENCOUNTER — Telehealth: Payer: Self-pay

## 2019-12-06 DIAGNOSIS — M9902 Segmental and somatic dysfunction of thoracic region: Secondary | ICD-10-CM | POA: Diagnosis not present

## 2019-12-06 DIAGNOSIS — M9903 Segmental and somatic dysfunction of lumbar region: Secondary | ICD-10-CM | POA: Diagnosis not present

## 2019-12-06 DIAGNOSIS — S335XXD Sprain of ligaments of lumbar spine, subsequent encounter: Secondary | ICD-10-CM | POA: Diagnosis not present

## 2019-12-06 DIAGNOSIS — S233XXD Sprain of ligaments of thoracic spine, subsequent encounter: Secondary | ICD-10-CM | POA: Diagnosis not present

## 2019-12-06 NOTE — Telephone Encounter (Signed)
Copied from [place] [phone]. Topic: General - Other >> Dec 06, 2019 10:49 AM Rainey Pines A wrote: Patient would like documentation from Dr. B saying that she is exempt from the vaccine because she has hypothyroidism.

## 2019-12-06 NOTE — Telephone Encounter (Signed)
Patient advised as below. Patient verbalizes understanding and is in agreement with treatment plan.  

## 2019-12-06 NOTE — Telephone Encounter (Signed)
She is not exempt from the vaccine.  The COVID19 vaccine is safe for people with hypothyroidism

## 2019-12-11 DIAGNOSIS — S233XXD Sprain of ligaments of thoracic spine, subsequent encounter: Secondary | ICD-10-CM | POA: Diagnosis not present

## 2019-12-11 DIAGNOSIS — M9902 Segmental and somatic dysfunction of thoracic region: Secondary | ICD-10-CM | POA: Diagnosis not present

## 2019-12-11 DIAGNOSIS — M4606 Spinal enthesopathy, lumbar region: Secondary | ICD-10-CM | POA: Diagnosis not present

## 2019-12-11 DIAGNOSIS — M9903 Segmental and somatic dysfunction of lumbar region: Secondary | ICD-10-CM | POA: Diagnosis not present

## 2019-12-11 DIAGNOSIS — Z7901 Long term (current) use of anticoagulants: Secondary | ICD-10-CM | POA: Diagnosis not present

## 2019-12-11 DIAGNOSIS — M546 Pain in thoracic spine: Secondary | ICD-10-CM | POA: Diagnosis not present

## 2019-12-11 DIAGNOSIS — M4605 Spinal enthesopathy, thoracolumbar region: Secondary | ICD-10-CM | POA: Diagnosis not present

## 2019-12-11 DIAGNOSIS — S335XXD Sprain of ligaments of lumbar spine, subsequent encounter: Secondary | ICD-10-CM | POA: Diagnosis not present

## 2019-12-11 DIAGNOSIS — M4604 Spinal enthesopathy, thoracic region: Secondary | ICD-10-CM | POA: Diagnosis not present

## 2019-12-11 DIAGNOSIS — M545 Low back pain: Secondary | ICD-10-CM | POA: Diagnosis not present

## 2019-12-11 DIAGNOSIS — M21751 Unequal limb length (acquired), right femur: Secondary | ICD-10-CM | POA: Diagnosis not present

## 2019-12-12 DIAGNOSIS — M5136 Other intervertebral disc degeneration, lumbar region: Secondary | ICD-10-CM | POA: Diagnosis not present

## 2019-12-12 DIAGNOSIS — M4186 Other forms of scoliosis, lumbar region: Secondary | ICD-10-CM | POA: Diagnosis not present

## 2019-12-12 DIAGNOSIS — M810 Age-related osteoporosis without current pathological fracture: Secondary | ICD-10-CM | POA: Diagnosis not present

## 2019-12-13 DIAGNOSIS — S233XXD Sprain of ligaments of thoracic spine, subsequent encounter: Secondary | ICD-10-CM | POA: Diagnosis not present

## 2019-12-13 DIAGNOSIS — M9903 Segmental and somatic dysfunction of lumbar region: Secondary | ICD-10-CM | POA: Diagnosis not present

## 2019-12-13 DIAGNOSIS — M9902 Segmental and somatic dysfunction of thoracic region: Secondary | ICD-10-CM | POA: Diagnosis not present

## 2019-12-13 DIAGNOSIS — S335XXD Sprain of ligaments of lumbar spine, subsequent encounter: Secondary | ICD-10-CM | POA: Diagnosis not present

## 2019-12-15 DIAGNOSIS — M9903 Segmental and somatic dysfunction of lumbar region: Secondary | ICD-10-CM | POA: Diagnosis not present

## 2019-12-15 DIAGNOSIS — M9902 Segmental and somatic dysfunction of thoracic region: Secondary | ICD-10-CM | POA: Diagnosis not present

## 2019-12-15 DIAGNOSIS — S233XXD Sprain of ligaments of thoracic spine, subsequent encounter: Secondary | ICD-10-CM | POA: Diagnosis not present

## 2019-12-15 DIAGNOSIS — S335XXD Sprain of ligaments of lumbar spine, subsequent encounter: Secondary | ICD-10-CM | POA: Diagnosis not present

## 2019-12-18 DIAGNOSIS — S233XXD Sprain of ligaments of thoracic spine, subsequent encounter: Secondary | ICD-10-CM | POA: Diagnosis not present

## 2019-12-18 DIAGNOSIS — M9902 Segmental and somatic dysfunction of thoracic region: Secondary | ICD-10-CM | POA: Diagnosis not present

## 2019-12-18 DIAGNOSIS — S335XXD Sprain of ligaments of lumbar spine, subsequent encounter: Secondary | ICD-10-CM | POA: Diagnosis not present

## 2019-12-18 DIAGNOSIS — M9903 Segmental and somatic dysfunction of lumbar region: Secondary | ICD-10-CM | POA: Diagnosis not present

## 2019-12-19 DIAGNOSIS — M4604 Spinal enthesopathy, thoracic region: Secondary | ICD-10-CM | POA: Diagnosis not present

## 2019-12-19 DIAGNOSIS — M546 Pain in thoracic spine: Secondary | ICD-10-CM | POA: Diagnosis not present

## 2019-12-19 DIAGNOSIS — M545 Low back pain: Secondary | ICD-10-CM | POA: Diagnosis not present

## 2019-12-19 DIAGNOSIS — M4605 Spinal enthesopathy, thoracolumbar region: Secondary | ICD-10-CM | POA: Diagnosis not present

## 2019-12-19 DIAGNOSIS — M4606 Spinal enthesopathy, lumbar region: Secondary | ICD-10-CM | POA: Diagnosis not present

## 2019-12-21 DIAGNOSIS — M4604 Spinal enthesopathy, thoracic region: Secondary | ICD-10-CM | POA: Diagnosis not present

## 2019-12-21 DIAGNOSIS — M4606 Spinal enthesopathy, lumbar region: Secondary | ICD-10-CM | POA: Diagnosis not present

## 2019-12-21 DIAGNOSIS — M4605 Spinal enthesopathy, thoracolumbar region: Secondary | ICD-10-CM | POA: Diagnosis not present

## 2019-12-21 DIAGNOSIS — M21751 Unequal limb length (acquired), right femur: Secondary | ICD-10-CM | POA: Diagnosis not present

## 2019-12-21 DIAGNOSIS — M546 Pain in thoracic spine: Secondary | ICD-10-CM | POA: Diagnosis not present

## 2019-12-21 DIAGNOSIS — Z7901 Long term (current) use of anticoagulants: Secondary | ICD-10-CM | POA: Diagnosis not present

## 2019-12-21 DIAGNOSIS — M545 Low back pain: Secondary | ICD-10-CM | POA: Diagnosis not present

## 2019-12-22 DIAGNOSIS — S335XXD Sprain of ligaments of lumbar spine, subsequent encounter: Secondary | ICD-10-CM | POA: Diagnosis not present

## 2019-12-22 DIAGNOSIS — M9903 Segmental and somatic dysfunction of lumbar region: Secondary | ICD-10-CM | POA: Diagnosis not present

## 2019-12-22 DIAGNOSIS — M9902 Segmental and somatic dysfunction of thoracic region: Secondary | ICD-10-CM | POA: Diagnosis not present

## 2019-12-22 DIAGNOSIS — S233XXD Sprain of ligaments of thoracic spine, subsequent encounter: Secondary | ICD-10-CM | POA: Diagnosis not present

## 2019-12-25 DIAGNOSIS — M21751 Unequal limb length (acquired), right femur: Secondary | ICD-10-CM | POA: Diagnosis not present

## 2019-12-25 DIAGNOSIS — M4605 Spinal enthesopathy, thoracolumbar region: Secondary | ICD-10-CM | POA: Diagnosis not present

## 2019-12-25 DIAGNOSIS — M4604 Spinal enthesopathy, thoracic region: Secondary | ICD-10-CM | POA: Diagnosis not present

## 2019-12-25 DIAGNOSIS — M545 Low back pain: Secondary | ICD-10-CM | POA: Diagnosis not present

## 2019-12-25 DIAGNOSIS — Z7901 Long term (current) use of anticoagulants: Secondary | ICD-10-CM | POA: Diagnosis not present

## 2019-12-25 DIAGNOSIS — M546 Pain in thoracic spine: Secondary | ICD-10-CM | POA: Diagnosis not present

## 2019-12-25 DIAGNOSIS — M4606 Spinal enthesopathy, lumbar region: Secondary | ICD-10-CM | POA: Diagnosis not present

## 2019-12-26 DIAGNOSIS — M4604 Spinal enthesopathy, thoracic region: Secondary | ICD-10-CM | POA: Diagnosis not present

## 2019-12-26 DIAGNOSIS — M21751 Unequal limb length (acquired), right femur: Secondary | ICD-10-CM | POA: Diagnosis not present

## 2019-12-26 DIAGNOSIS — M546 Pain in thoracic spine: Secondary | ICD-10-CM | POA: Diagnosis not present

## 2019-12-26 DIAGNOSIS — Z7901 Long term (current) use of anticoagulants: Secondary | ICD-10-CM | POA: Diagnosis not present

## 2019-12-26 DIAGNOSIS — M545 Low back pain: Secondary | ICD-10-CM | POA: Diagnosis not present

## 2019-12-26 DIAGNOSIS — M4605 Spinal enthesopathy, thoracolumbar region: Secondary | ICD-10-CM | POA: Diagnosis not present

## 2019-12-26 DIAGNOSIS — M4606 Spinal enthesopathy, lumbar region: Secondary | ICD-10-CM | POA: Diagnosis not present

## 2019-12-28 DIAGNOSIS — M545 Low back pain: Secondary | ICD-10-CM | POA: Diagnosis not present

## 2019-12-28 DIAGNOSIS — M4606 Spinal enthesopathy, lumbar region: Secondary | ICD-10-CM | POA: Diagnosis not present

## 2019-12-28 DIAGNOSIS — M21751 Unequal limb length (acquired), right femur: Secondary | ICD-10-CM | POA: Diagnosis not present

## 2019-12-28 DIAGNOSIS — M4605 Spinal enthesopathy, thoracolumbar region: Secondary | ICD-10-CM | POA: Diagnosis not present

## 2019-12-28 DIAGNOSIS — M546 Pain in thoracic spine: Secondary | ICD-10-CM | POA: Diagnosis not present

## 2019-12-28 DIAGNOSIS — Z7901 Long term (current) use of anticoagulants: Secondary | ICD-10-CM | POA: Diagnosis not present

## 2019-12-28 DIAGNOSIS — M4604 Spinal enthesopathy, thoracic region: Secondary | ICD-10-CM | POA: Diagnosis not present

## 2019-12-29 DIAGNOSIS — M9903 Segmental and somatic dysfunction of lumbar region: Secondary | ICD-10-CM | POA: Diagnosis not present

## 2019-12-29 DIAGNOSIS — S335XXD Sprain of ligaments of lumbar spine, subsequent encounter: Secondary | ICD-10-CM | POA: Diagnosis not present

## 2019-12-29 DIAGNOSIS — M9902 Segmental and somatic dysfunction of thoracic region: Secondary | ICD-10-CM | POA: Diagnosis not present

## 2019-12-29 DIAGNOSIS — S233XXD Sprain of ligaments of thoracic spine, subsequent encounter: Secondary | ICD-10-CM | POA: Diagnosis not present

## 2020-01-02 DIAGNOSIS — M21751 Unequal limb length (acquired), right femur: Secondary | ICD-10-CM | POA: Diagnosis not present

## 2020-01-02 DIAGNOSIS — M546 Pain in thoracic spine: Secondary | ICD-10-CM | POA: Diagnosis not present

## 2020-01-02 DIAGNOSIS — M4605 Spinal enthesopathy, thoracolumbar region: Secondary | ICD-10-CM | POA: Diagnosis not present

## 2020-01-02 DIAGNOSIS — M545 Low back pain: Secondary | ICD-10-CM | POA: Diagnosis not present

## 2020-01-02 DIAGNOSIS — Z7901 Long term (current) use of anticoagulants: Secondary | ICD-10-CM | POA: Diagnosis not present

## 2020-01-02 DIAGNOSIS — M4604 Spinal enthesopathy, thoracic region: Secondary | ICD-10-CM | POA: Diagnosis not present

## 2020-01-02 DIAGNOSIS — M4606 Spinal enthesopathy, lumbar region: Secondary | ICD-10-CM | POA: Diagnosis not present

## 2020-01-04 DIAGNOSIS — M4604 Spinal enthesopathy, thoracic region: Secondary | ICD-10-CM | POA: Diagnosis not present

## 2020-01-04 DIAGNOSIS — M546 Pain in thoracic spine: Secondary | ICD-10-CM | POA: Diagnosis not present

## 2020-01-04 DIAGNOSIS — M545 Low back pain: Secondary | ICD-10-CM | POA: Diagnosis not present

## 2020-01-04 DIAGNOSIS — M4605 Spinal enthesopathy, thoracolumbar region: Secondary | ICD-10-CM | POA: Diagnosis not present

## 2020-01-04 DIAGNOSIS — M21751 Unequal limb length (acquired), right femur: Secondary | ICD-10-CM | POA: Diagnosis not present

## 2020-01-04 DIAGNOSIS — M4606 Spinal enthesopathy, lumbar region: Secondary | ICD-10-CM | POA: Diagnosis not present

## 2020-01-04 DIAGNOSIS — Z7901 Long term (current) use of anticoagulants: Secondary | ICD-10-CM | POA: Diagnosis not present

## 2020-01-09 DIAGNOSIS — M4606 Spinal enthesopathy, lumbar region: Secondary | ICD-10-CM | POA: Diagnosis not present

## 2020-01-09 DIAGNOSIS — Z7901 Long term (current) use of anticoagulants: Secondary | ICD-10-CM | POA: Diagnosis not present

## 2020-01-09 DIAGNOSIS — M546 Pain in thoracic spine: Secondary | ICD-10-CM | POA: Diagnosis not present

## 2020-01-09 DIAGNOSIS — M21751 Unequal limb length (acquired), right femur: Secondary | ICD-10-CM | POA: Diagnosis not present

## 2020-01-09 DIAGNOSIS — M4604 Spinal enthesopathy, thoracic region: Secondary | ICD-10-CM | POA: Diagnosis not present

## 2020-01-09 DIAGNOSIS — M4605 Spinal enthesopathy, thoracolumbar region: Secondary | ICD-10-CM | POA: Diagnosis not present

## 2020-01-09 DIAGNOSIS — M545 Low back pain: Secondary | ICD-10-CM | POA: Diagnosis not present

## 2020-01-10 DIAGNOSIS — Z23 Encounter for immunization: Secondary | ICD-10-CM | POA: Diagnosis not present

## 2020-01-11 DIAGNOSIS — M21751 Unequal limb length (acquired), right femur: Secondary | ICD-10-CM | POA: Diagnosis not present

## 2020-01-11 DIAGNOSIS — M546 Pain in thoracic spine: Secondary | ICD-10-CM | POA: Diagnosis not present

## 2020-01-11 DIAGNOSIS — M4605 Spinal enthesopathy, thoracolumbar region: Secondary | ICD-10-CM | POA: Diagnosis not present

## 2020-01-11 DIAGNOSIS — Z7901 Long term (current) use of anticoagulants: Secondary | ICD-10-CM | POA: Diagnosis not present

## 2020-01-11 DIAGNOSIS — M4604 Spinal enthesopathy, thoracic region: Secondary | ICD-10-CM | POA: Diagnosis not present

## 2020-01-11 DIAGNOSIS — M4606 Spinal enthesopathy, lumbar region: Secondary | ICD-10-CM | POA: Diagnosis not present

## 2020-01-11 DIAGNOSIS — M545 Low back pain: Secondary | ICD-10-CM | POA: Diagnosis not present

## 2020-01-11 NOTE — Progress Notes (Signed)
Subjective:   Megan Woods is a 85 y.o. female who presents for Medicare Annual (Subsequent) preventive examination.    This visit is being conducted via phone call  - after an attmept to do on video chat - due to the COVID-19 pandemic. This patient has given me verbal consent via phone to conduct this visit, patient states they are participating from their home address. Some vital signs may be absent or patient reported.    Patient identification: identified by name, DOB, and current address.  Review of Systems:  N/A  Cardiac Risk Factors include: advanced age (>72men, >56 women);hypertension     Objective:     Vitals: There were no vitals taken for this visit.  There is no height or weight on file to calculate BMI. Unable to obtain vitals due to visit being conducted via telephonically. '  Advanced Directives 01/15/2020 01/09/2019  Does Patient Have a Medical Advance Directive? Yes Yes  Type of Paramedic of [place];Living will [place];Living will  Copy of [place] in Chart? No - copy requested No - copy requested    Tobacco Social History   Tobacco Use  Smoking Status Never Smoker  Smokeless Tobacco Never Used     Counseling given: Not Answered   Clinical Intake:  Pre-visit preparation completed: Yes  Pain : No/denies pain Pain Score: 0-No pain     Nutritional Risks: None Diabetes: No  How often do you need to have someone help you when you read instructions, pamphlets, or other written materials from your doctor or pharmacy?: 1 - Never  Interpreter Needed?: No  Information entered by :: [facility], LPN  Past Medical History:  Diagnosis Date  . Allergy   . Arthritis   . 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 Hypertension   . Hypothyroidism   . Mitral valve disorder   . Osteoporosis    Past Surgical History:  Procedure Laterality Date  . ABDOMINAL HYSTERECTOMY  1975   also took out  L ovary and cervix  . APPENDECTOMY    . CATARACT EXTRACTION    . OOPHORECTOMY     R ovary removed many years after hysterectomy for benign cyst  . SMALL BOWEL REPAIR     perforated during oophorectomy  . TONSILLECTOMY     Family History  Problem Relation Age of Onset  . Breast cancer Sister 24   Social History   Socioeconomic History  . Marital status: Married    Spouse name: Not on file  . Number of children: 3  . Years of education: Not on file  . Highest education level: Master's degree (e.g., MA, MS, MEng, MEd, MSW, MBA)  Occupational History  . Occupation: retired  Tobacco Use  . Smoking status: Never Smoker  . Smokeless tobacco: Never Used  Substance and Sexual Activity  . Alcohol use: Never  . Drug use: Never  . Sexual activity: Not on file  Other Topics Concern  . Not on file  Social History Narrative  . Not on file   Social Determinants of Health   Financial Resource Strain: Low Risk   . Difficulty of Paying Living Expenses: Not hard at all  Food Insecurity: No Food Insecurity  . Worried About Charity fundraiser in the Last Year: Never true  . Ran Out of Food in the Last Year: Never true  Transportation Needs: No Transportation Needs  . Lack of Transportation (Medical): No  . Lack of Transportation (  Non-Medical): No  Physical Activity: Insufficiently Active  . Days of Exercise per Week: 3 days  . Minutes of Exercise per Session: 30 min  Stress: No Stress Concern Present  . Feeling of Stress : Not at all  Social Connections: Not Isolated  . Frequency of Communication with Friends and Family: More than three times a week  . Frequency of Social Gatherings with Friends and Family: More than three times a week  . Attends Religious Services: More than 4 times per year  . Active Member of Clubs or Organizations: Yes  . Attends Archivist Meetings: More than 4 times per year  . Marital Status: Married    Outpatient Encounter Medications as of  01/15/2020  Medication Sig  . Calcium Carbonate (CALCIUM 600 PO) Take 600 mg by mouth daily.   . Cholecalciferol (VITAMIN D PO) Take 1,000 mg by mouth daily.  Marland Kitchen estradiol (VIVELLE-DOT) 0.0375 MG/24HR Place 1 patch onto the skin 2 (two) times a week.   . hydrochlorothiazide (HYDRODIURIL) 12.5 MG tablet Take 1 tablet (12.5 mg total) by mouth daily.  Marland Kitchen SYNTHROID 100 MCG tablet TAKE 1 TABLET BY MOUTH ONCE DAILY BEFORE BREAKFAST   No facility-administered encounter medications on file as of 01/15/2020.    Activities of Daily Living In your present state of health, do you have any difficulty performing the following activities: 01/15/2020  Hearing? Y  Comment Wears bilateral hearing aids.  Vision? N  Difficulty concentrating or making decisions? N  Walking or climbing stairs? N  Dressing or bathing? N  Doing errands, shopping? N  Preparing Food and eating ? N  Using the Toilet? N  In the past six months, have you accidently leaked urine? N  Do you have problems with loss of bowel control? N  Managing your Medications? N  Managing your Finances? N  Housekeeping or managing your Housekeeping? N  Some recent data might be hidden    Patient Care Team: Bacigalupo, Dionne Bucy, MD as PCP - General (Family Medicine) Romine, Lubertha South, MD (Obstetrics and Gynecology)    Assessment:   This is a routine wellness examination for Megan Woods.  Exercise Activities and Dietary recommendations Current Exercise Habits: Home exercise routine, Type of exercise: stretching;walking, Time (Minutes): 30, Frequency (Times/Week): 3, Weekly Exercise (Minutes/Week): 90, Intensity: Mild, Exercise limited by: None identified  Goals   None     Fall Risk: Fall Risk  01/15/2020 08/29/2019 01/09/2019 07/08/2018  Falls in the past year? 0 0 0 0  Number falls in past yr: 0 0 - -  Injury with Fall? 0 0 - -  Follow up - Falls evaluation completed - -    FALL RISK PREVENTION PERTAINING TO THE HOME:  Any stairs in or  around the home? Yes  If so, are there any without handrails? No   Home free of loose throw rugs in walkways, pet beds, electrical cords, etc? Yes  Adequate lighting in your home to reduce risk of falls? Yes   ASSISTIVE DEVICES UTILIZED TO PREVENT FALLS:  Life alert? No  Use of a cane, walker or w/c? No  Grab bars in the bathroom? Yes  Shower chair or bench in shower? No  Elevated toilet seat or a handicapped toilet? Yes    TIMED UP AND GO:  Was the test performed? No .    Depression Screen PHQ 2/9 Scores 01/15/2020 01/09/2019 07/08/2018  PHQ - 2 Score 0 0 0  PHQ- 9 Score - - 0  Cognitive Function: Declined today.     6CIT Screen 01/09/2019  What Year? 0 points  What month? 0 points  What time? 0 points  Count back from 20 0 points  Months in reverse 0 points  Repeat phrase 0 points  Total Score 0     There is no immunization history on file for this patient.  Qualifies for Shingles Vaccine? Yes . Due for Shingrix. Pt has been advised to call insurance company to determine out of pocket expense. Advised may also receive vaccine at local pharmacy or Health Dept. Verbalized acceptance and understanding.  Tdap: Although this vaccine is not a covered service during a Wellness Exam, does the patient still wish to receive this vaccine today?  No . Advised may receive this vaccine at local pharmacy or Health Dept. Aware to provide a copy of the vaccination record if obtained from local pharmacy or Health Dept. Verbalized acceptance and understanding.  Flu Vaccine: Due fall 2021  Pneumococcal Vaccine: Due for Pneumococcal vaccine. Does the patient want to receive this vaccine today?  No . Advised may receive this vaccine at local pharmacy or Health Dept. Aware to provide a copy of the vaccination record if obtained from local pharmacy or Health Dept. Verbalized acceptance and understanding.   Screening Tests Health Maintenance  Topic Date Due  . COVID-19 Vaccine (1) Never  done  . PNA vac Low Risk Adult (1 of 2 - PCV13) Never done  . TETANUS/TDAP  08/13/2023 (Originally 06/04/1954)  . INFLUENZA VACCINE  04/07/2020  . DEXA SCAN  04/07/2020    Cancer Screenings:  Colorectal Screening: No longer required.   Mammogram: No longer required.   Bone Density: Completed 04/07/18. Results reflect OSTEOPOROSIS. Repeat every 2 years.   Lung Cancer Screening: (Low Dose CT Chest recommended if Age 83-80 years, 30 pack-year currently smoking OR have quit w/in 15years.) does not qualify.   Additional Screening:  Vision Screening: Recommended annual ophthalmology exams for early detection of glaucoma and other disorders of the eye.  Dental Screening: Recommended annual dental exams for proper oral hygiene  Community Resource Referral:  CRR required this visit?  No       Plan:  I have personally reviewed and addressed the Medicare Annual Wellness questionnaire and have noted the following in the patient's chart:  A. Medical and social history B. Use of alcohol, tobacco or illicit drugs  C. Current medications and supplements D. Functional ability and status E.  Nutritional status F.  Physical activity G. Advance directives H. List of other physicians I.  Hospitalizations, surgeries, and ER visits in previous 12 months J.  [place] such as hearing and vision if needed, cognitive and depression L. Referrals and appointments   In addition, I have reviewed and discussed with patient certain preventive protocols, quality metrics, and best practice recommendations. A written personalized care plan for preventive services as well as general preventive health recommendations were provided to patient. Nurse Health Advisor  Signed,    Parlee Amescua Crocker, [place]  [id number] Nurse Health Advisor   Nurse Notes: Declined receiving any vaccine now or in the future.

## 2020-01-15 ENCOUNTER — Other Ambulatory Visit: Payer: Self-pay

## 2020-01-15 ENCOUNTER — Ambulatory Visit (INDEPENDENT_AMBULATORY_CARE_PROVIDER_SITE_OTHER): Payer: Medicare Other

## 2020-01-15 DIAGNOSIS — Z0000 Encounter for general adult medical examination without abnormal findings: Secondary | ICD-10-CM

## 2020-01-15 NOTE — Patient Instructions (Signed)
[name] , Thank you for taking time to come for your Medicare Wellness Visit. I appreciate your ongoing commitment to your health goals. Please review the following plan we discussed and let me know if I can assist you in the future.   Screening recommendations/referrals: Colonoscopy: No longer required.  Mammogram: No longer required.  Bone Density: Up to date, due 04/2020 Recommended yearly ophthalmology/optometry visit for glaucoma screening and checkup Recommended yearly dental visit for hygiene and checkup  Vaccinations: Influenza vaccine: Due fall 2021 Pneumococcal vaccine: Pt declines today.  Tdap vaccine: Pt declines today.  Shingles vaccine: Pt declines today.     Advanced directives: Please bring a copy of your POA (Power of Attorney) and/or Living Will to your next appointment.   Conditions/risks identified: None.  Next appointment: 03/01/20 @ 10:00 AM with Dr Brita Romp    Preventive Care 83 Years and Older, Female Preventive care refers to lifestyle choices and visits with your health care provider that can promote health and wellness. What does preventive care include?  A yearly physical exam. This is also called an annual well check.  Dental exams once or twice a year.  Routine eye exams. Ask your health care provider how often you should have your eyes checked.  Personal lifestyle choices, including:  Daily care of your teeth and gums.  Regular physical activity.  Eating a healthy diet.  Avoiding tobacco and drug use.  Limiting alcohol use.  Practicing safe sex.  Taking low-dose aspirin every day.  Taking vitamin and mineral supplements as recommended by your health care provider. What happens during an annual well check? The services and screenings done by your health care provider during your annual well check will depend on your age, overall health, lifestyle risk factors, and family history of disease. Counseling  Your health care provider may  ask you questions about your:  Alcohol use.  Tobacco use.  Drug use.  Emotional well-being.  Home and relationship well-being.  Sexual activity.  Eating habits.  History of falls.  Memory and ability to understand (cognition).  Work and work Statistician.  Reproductive health. Screening  You may have the following tests or measurements:  Height, weight, and BMI.  Blood pressure.  Lipid and cholesterol levels. These may be checked every 5 years, or more frequently if you are over 1 years old.  Skin check.  Lung cancer screening. You may have this screening every year starting at age 83 if you have a 30-pack-year history of smoking and currently smoke or have quit within the past 15 years.  Fecal occult blood test (FOBT) of the stool. You may have this test every year starting at age 83.  Flexible sigmoidoscopy or colonoscopy. You may have a sigmoidoscopy every 5 years or a colonoscopy every 10 years starting at age 55.  Hepatitis C blood test.  Hepatitis B blood test.  Sexually transmitted disease (STD) testing.  Diabetes screening. This is done by checking your blood sugar (glucose) after you have not eaten for a while (fasting). You may have this done every 1-3 years.  Bone density scan. This is done to screen for osteoporosis. You may have this done starting at age 21.  Mammogram. This may be done every 1-2 years. Talk to your health care provider about how often you should have regular mammograms. Talk with your health care provider about your test results, treatment options, and if necessary, the need for more tests. Vaccines  Your health care provider may recommend certain vaccines,  such as:  Influenza vaccine. This is recommended every year.  Tetanus, diphtheria, and acellular pertussis (Tdap, Td) vaccine. You may need a Td booster every 10 years.  Zoster vaccine. You may need this after age 48.  Pneumococcal 13-valent conjugate (PCV13) vaccine. One  dose is recommended after age 83.  Pneumococcal polysaccharide (PPSV23) vaccine. One dose is recommended after age 83. Talk to your health care provider about which screenings and vaccines you need and how often you need them. This information is not intended to replace advice given to you by your health care provider. Make sure you discuss any questions you have with your health care provider. Document Released: 09/20/2015 Document Revised: 05/13/2016 Document Reviewed: 06/25/2015 Elsevier Interactive Patient Education  2017 [place] Prevention in the Home Falls can cause injuries. They can happen to people of all ages. There are many things you can do to make your home safe and to help prevent falls. What can I do on the outside of my home?  Regularly fix the edges of walkways and driveways and fix any cracks.  Remove anything that might make you trip as you walk through a door, such as a raised step or threshold.  Trim any bushes or trees on the path to your home.  Use bright outdoor lighting.  Clear any walking paths of anything that might make someone trip, such as rocks or tools.  Regularly check to see if handrails are loose or broken. Make sure that both sides of any steps have handrails.  Any raised decks and porches should have guardrails on the edges.  Have any leaves, snow, or ice cleared regularly.  Use sand or salt on walking paths during winter.  Clean up any spills in your garage right away. This includes oil or grease spills. What can I do in the bathroom?  Use night lights.  Install grab bars by the toilet and in the tub and shower. Do not use towel bars as grab bars.  Use non-skid mats or decals in the tub or shower.  If you need to sit down in the shower, use a plastic, non-slip stool.  Keep the floor dry. Clean up any water that spills on the floor as soon as it happens.  Remove soap buildup in the tub or shower regularly.  Attach bath  mats securely with double-sided non-slip rug tape.  Do not have throw rugs and other things on the floor that can make you trip. What can I do in the bedroom?  Use night lights.  Make sure that you have a light by your bed that is easy to reach.  Do not use any sheets or blankets that are too big for your bed. They should not hang down onto the floor.  Have a firm chair that has side arms. You can use this for support while you get dressed.  Do not have throw rugs and other things on the floor that can make you trip. What can I do in the kitchen?  Clean up any spills right away.  Avoid walking on wet floors.  Keep items that you use a lot in easy-to-reach places.  If you need to reach something above you, use a strong step stool that has a grab bar.  Keep electrical cords out of the way.  Do not use floor polish or wax that makes floors slippery. If you must use wax, use non-skid floor wax.  Do not have throw rugs and other things on  the floor that can make you trip. What can I do with my stairs?  Do not leave any items on the stairs.  Make sure that there are handrails on both sides of the stairs and use them. Fix handrails that are broken or loose. Make sure that handrails are as long as the stairways.  Check any carpeting to make sure that it is firmly attached to the stairs. Fix any carpet that is loose or worn.  Avoid having throw rugs at the top or bottom of the stairs. If you do have throw rugs, attach them to the floor with carpet tape.  Make sure that you have a light switch at the top of the stairs and the bottom of the stairs. If you do not have them, ask someone to add them for you. What else can I do to help prevent falls?  Wear shoes that:  Do not have high heels.  Have rubber bottoms.  Are comfortable and fit you well.  Are closed at the toe. Do not wear sandals.  If you use a stepladder:  Make sure that it is fully opened. Do not climb a closed  stepladder.  Make sure that both sides of the stepladder are locked into place.  Ask someone to hold it for you, if possible.  Clearly mark and make sure that you can see:  Any grab bars or handrails.  First and last steps.  Where the edge of each step is.  Use tools that help you move around (mobility aids) if they are needed. These include:  Canes.  Walkers.  Scooters.  Crutches.  Turn on the lights when you go into a dark area. Replace any light bulbs as soon as they burn out.  Set up your furniture so you have a clear path. Avoid moving your furniture around.  If any of your floors are uneven, fix them.  If there are any pets around you, be aware of where they are.  Review your medicines with your doctor. Some medicines can make you feel dizzy. This can increase your chance of falling. Ask your doctor what other things that you can do to help prevent falls. This information is not intended to replace advice given to you by your health care provider. Make sure you discuss any questions you have with your health care provider. Document Released: 06/20/2009 Document Revised: 01/30/2016 Document Reviewed: 09/28/2014 Elsevier Interactive Patient Education  2017 [employer].

## 2020-01-31 DIAGNOSIS — Z23 Encounter for immunization: Secondary | ICD-10-CM | POA: Diagnosis not present

## 2020-02-07 ENCOUNTER — Encounter: Payer: Self-pay | Admitting: Family Medicine

## 2020-02-07 ENCOUNTER — Other Ambulatory Visit: Payer: Self-pay

## 2020-02-07 ENCOUNTER — Ambulatory Visit (INDEPENDENT_AMBULATORY_CARE_PROVIDER_SITE_OTHER): Payer: Medicare Other | Admitting: Family Medicine

## 2020-02-07 VITALS — BP 141/87 | HR 76

## 2020-02-07 DIAGNOSIS — I10 Essential (primary) hypertension: Secondary | ICD-10-CM | POA: Diagnosis not present

## 2020-02-07 DIAGNOSIS — E039 Hypothyroidism, unspecified: Secondary | ICD-10-CM | POA: Diagnosis not present

## 2020-02-07 DIAGNOSIS — H903 Sensorineural hearing loss, bilateral: Secondary | ICD-10-CM | POA: Diagnosis not present

## 2020-02-07 DIAGNOSIS — H6123 Impacted cerumen, bilateral: Secondary | ICD-10-CM | POA: Diagnosis not present

## 2020-02-07 NOTE — Assessment & Plan Note (Signed)
Discussed that if blood pressure is well controlled, I would consider holding her blood pressure medicine for 1 month or so to see if it is elevated off of the medication, but as it is elevated today, I would continue her current medications Encouraged low-sodium diet and regular exercise Recheck BMP Follow-up at upcoming CPE

## 2020-02-07 NOTE — Assessment & Plan Note (Signed)
Previously well controlled Continue Synthroid at current dose  Recheck TSH and adjust Synthroid as indicated   

## 2020-02-07 NOTE — Progress Notes (Signed)
Established patient visit   Patient: Megan Woods   DOB: [birth year]   85 y.o. Female  MRN: [id number] Visit Date: 02/07/2020  Today's healthcare provider: Lavon Paganini, MD   Chief Complaint  Patient presents with  . Hypertension  . Hypothyroidism   Subjective    HPI  HTN: - Medications: HCTZ 12.5 mg daily - Compliance: good - Checking BP at home: no - but eports BP 130/80 at recent OV - would like to stop antihypertensives - believes that BP may be well controlled without meds - Denies any SOB, CP, vision changes, LE edema, medication SEs, or symptoms of hypotension - Diet: low sodium - Exercise: walks regularly   Hypothyroidism: Taking synthroid with good compliance.  Denies any symptoms   Social History   Tobacco Use  . Smoking status: Never Smoker  . Smokeless tobacco: Never Used  Substance Use Topics  . Alcohol use: Never  . Drug use: Never       Medications: Outpatient Medications Prior to Visit  Medication Sig  . Calcium Carbonate (CALCIUM 600 PO) Take 600 mg by mouth daily.   . Cholecalciferol (VITAMIN D PO) Take 1,000 mg by mouth daily.  Marland Kitchen estradiol (VIVELLE-DOT) 0.0375 MG/24HR Place 1 patch onto the skin 2 (two) times a week.   . hydrochlorothiazide (HYDRODIURIL) 12.5 MG tablet Take 1 tablet (12.5 mg total) by mouth daily.  Marland Kitchen SYNTHROID 100 MCG tablet TAKE 1 TABLET BY MOUTH ONCE DAILY BEFORE BREAKFAST   No facility-administered medications prior to visit.    Review of Systems  Constitutional: Negative.   Respiratory: Negative.   Cardiovascular: Negative.   Neurological: Negative.   Psychiatric/Behavioral: Negative.        Objective    BP (!) 141/87   Pulse 76  BP Readings from Last 3 Encounters:  02/07/20 (!) 141/87  08/29/19 135/78  02/17/19 126/70   Wt Readings from Last 3 Encounters:  08/29/19 122 lb (55.3 kg)  08/12/18 123 lb 3.2 oz (55.9 kg)  07/08/18 122 lb 6.4 oz (55.5 kg)    Physical Exam Vitals reviewed.    Constitutional:      General: She is not in acute distress.    Appearance: Normal appearance. She is well-developed. She is not diaphoretic.  HENT:     Head: Normocephalic and atraumatic.  Eyes:     General: No scleral icterus.    Conjunctiva/sclera: Conjunctivae normal.  Neck:     Thyroid: No thyromegaly.  Cardiovascular:     Rate and Rhythm: Normal rate and regular rhythm.     Pulses: Normal pulses.     Heart sounds: Normal heart sounds. No murmur.  Pulmonary:     Effort: Pulmonary effort is normal. No respiratory distress.     Breath sounds: Normal breath sounds. No wheezing, rhonchi or rales.  Musculoskeletal:     Cervical back: Neck supple.     Right lower leg: No edema.     Left lower leg: No edema.  Lymphadenopathy:     Cervical: No cervical adenopathy.  Skin:    General: Skin is warm and dry.     Findings: No rash.  Neurological:     Mental Status: She is alert and oriented to person, place, and time. Mental status is at baseline.  Psychiatric:        Mood and Affect: Mood normal.        Behavior: Behavior normal.       No results found for any visits on 02/07/20.  Assessment & Plan     Problem List Items Addressed This Visit      Cardiovascular and Mediastinum   Essential hypertension - Primary    Discussed that if blood pressure is well controlled, I would consider holding her blood pressure medicine for 1 month or so to see if it is elevated off of the medication, but as it is elevated today, I would continue her current medications Encouraged low-sodium diet and regular exercise Recheck BMP Follow-up at upcoming CPE      Relevant Orders   Basic Metabolic Panel (BMET)     Endocrine   Hypothyroidism    Previously well controlled Continue Synthroid at current dose  Recheck TSH and adjust Synthroid as indicated        Relevant Orders   TSH       Return for as scheduled.      I, Lavon Paganini, MD, have reviewed all documentation for this  visit. The documentation on 02/07/20 for the exam, diagnosis, procedures, and orders are all accurate and complete.   Megan Woods, Dionne Bucy, MD, MPH [facility]

## 2020-02-08 ENCOUNTER — Telehealth: Payer: Self-pay

## 2020-02-08 LAB — BASIC METABOLIC PANEL
BUN/Creatinine Ratio: 15 (ref 12–28)
BUN: 12 mg/dL (ref 8–27)
CO2: 24 mmol/L (ref 20–29)
Calcium: 9.1 mg/dL (ref 8.7–10.3)
Chloride: 102 mmol/L (ref 96–106)
Creatinine, Ser: 0.8 mg/dL (ref 0.57–1.00)
GFR calc Af Amer: 79 mL/min/{1.73_m2} (ref >59)
GFR calc non Af Amer: 69 mL/min/{1.73_m2} (ref >59)
Glucose: 112 mg/dL — ABNORMAL HIGH (ref 65–99)
Potassium: 3.7 mmol/L (ref 3.5–5.2)
Sodium: 141 mmol/L (ref 134–144)

## 2020-02-08 LAB — TSH: TSH: 0.5 u[IU]/mL (ref 0.450–4.500)

## 2020-02-08 NOTE — Telephone Encounter (Signed)
Pt returned call and was given the message from [facility] normal labs.

## 2020-02-08 NOTE — Telephone Encounter (Signed)
LMTCB, PEC may give result.  

## 2020-02-08 NOTE — Telephone Encounter (Signed)
-----   Message from Virginia Crews, MD sent at 02/08/2020  8:26 AM EDT ----- Normal labs

## 2020-03-01 ENCOUNTER — Encounter: Payer: Self-pay | Admitting: Family Medicine

## 2020-03-04 ENCOUNTER — Other Ambulatory Visit: Payer: Self-pay | Admitting: Family Medicine

## 2020-03-04 MED ORDER — SYNTHROID 100 MCG PO TABS: ORAL_TABLET | ORAL | 1 refills | Status: DC

## 2020-03-04 NOTE — Telephone Encounter (Signed)
Copied from [place] [id number]. Topic: Quick Communication - Rx Refill/Question >> Mar 04, 2020 11:07 AM Leward Quan A wrote: Medication: SYNTHROID 100 MCG tablet    Has the patient contacted their pharmacy? Yes.   (Agent: If no, request that the patient contact the pharmacy for the refill.) (Agent: If yes, when and what did the pharmacy advise?)  Preferred Pharmacy (with phone number or street name): [place], [place]  Phone:  [phone] Fax:  [phone]     Agent: Please be advised that RX refills may take up to 3 business days. We ask that you follow-up with your pharmacy.

## 2020-03-29 DIAGNOSIS — Z01419 Encounter for gynecological examination (general) (routine) without abnormal findings: Secondary | ICD-10-CM | POA: Diagnosis not present

## 2020-04-04 ENCOUNTER — Telehealth: Payer: Self-pay

## 2020-04-04 NOTE — Telephone Encounter (Signed)
Copied from [place] [id number]. Topic: General - Inquiry >> Apr 04, 2020 12:53 PM Alease Frame wrote: Reason for CRM: Pt is needing a call back regarding a mammogram . She states Dr B will need to order this for her . Please reach out to pt

## 2020-04-04 NOTE — Telephone Encounter (Signed)
So I think she has these done at [facility]. If that is the case she just needs to call them to schedule. They do not need orders for baseline.

## 2020-04-04 NOTE — Telephone Encounter (Signed)
Attempted to contact patient, no answer left a voicemail. Okay for PEC to advise patient.  

## 2020-04-05 NOTE — Telephone Encounter (Signed)
Attempted to call patient regarding information provided by provider- left message on VM to call office

## 2020-04-16 NOTE — Progress Notes (Addendum)
Established patient visit   Patient: Megan Woods   DOB: 09/26/1934   85 y.o. Female  MRN: [id number] Visit Date: 04/17/2020  Today's healthcare provider: Trinna Post, PA-C   Chief Complaint  Patient presents with  . Back Pain  . Fall  . Head Injury   Subjective    Fall The accident occurred 5 to 7 days ago. Associated symptoms include headaches.    Patient presents today for fall on Thursday 04/11/2020. Patient reports that she was putting on her pants and fell over and hit her head on her chester drawer. She has bruising on the left side of her head. She did not feel dizzy or pass out, she simply lost her balance. She hit her right hip when she fell. She is currently experiencing diffuse back pain. She does have osteoporosis. She did not have dizziness, nausea, vision change, LOC after fall. She does have a bruise on her head. She is not UTD on her tetanus shot and she declines at this time. She was able to get up independently after the fall and denies hip pain bilaterally. She has taken tylenol for pain without much relief.     Medications: Outpatient Medications Prior to Visit  Medication Sig  . Calcium Carbonate (CALCIUM 600 PO) Take 600 mg by mouth daily.   . Cholecalciferol (VITAMIN D PO) Take 1,000 mg by mouth daily.  Marland Kitchen estradiol (VIVELLE-DOT) 0.0375 MG/24HR Place 1 patch onto the skin 2 (two) times a week.   . hydrochlorothiazide (HYDRODIURIL) 12.5 MG tablet Take 1 tablet (12.5 mg total) by mouth daily.  Marland Kitchen SYNTHROID 100 MCG tablet TAKE 1 TABLET BY MOUTH ONCE DAILY BEFORE BREAKFAST   No facility-administered medications prior to visit.    Review of Systems  Constitutional: Positive for fatigue.  Cardiovascular: Negative for chest pain, palpitations and leg swelling.  Musculoskeletal: Positive for back pain and myalgias.  Skin: Positive for color change.  Neurological: Positive for headaches.  Hematological: Bruises/bleeds easily.  Psychiatric/Behavioral:  Positive for sleep disturbance.      Objective    BP (!) 143/96   Pulse 89   Temp 98.3 F (36.8 C)   Ht 5' 4.5" (1.638 m)   Wt 116 lb 9.6 oz (52.9 kg)   BMI 19.71 kg/m    Physical Exam Constitutional:      Appearance: Normal appearance.  HENT:     Head: Contusion present. No raccoon eyes or Battle's sign.   Eyes:     Extraocular Movements: Extraocular movements intact.     Pupils: Pupils are equal, round, and reactive to light.  Cardiovascular:     Pulses: Normal pulses.     Heart sounds: Normal heart sounds.  Pulmonary:     Effort: Pulmonary effort is normal.     Breath sounds: Normal breath sounds.  Musculoskeletal:       Back:  Skin:    General: Skin is warm and dry.  Neurological:     General: No focal deficit present.     Mental Status: She is alert and oriented to person, place, and time. Mental status is at baseline.     Cranial Nerves: No cranial nerve deficit.     Sensory: No sensory deficit.     Motor: No weakness.     Gait: Gait normal.  Psychiatric:        Mood and Affect: Mood normal.        Behavior: Behavior normal.       No  results found for any visits on 04/17/20.  Assessment & Plan    1. Injury of head, initial encounter  Patient with head injury after fall last week. No acute signs of neurodeficits and patient is not on blood thinner. However, patient did hit her head on the corner of a dresser and is quite elderly so will proceed with CT scan to rule out injury/bleed. Offered patient narcotics, 2.5 mg/325 mg hydrocodone/acetaminophen BID as patient is wary of narcotics but patient declines at this time. Patient declines tetanus shot at this time. She may continue with Tylenol for pain management now.   Patient is asking about insurance coverage for her imaging. Advised that I am not aware what the coverage will be. She asked if I can order imaging through [facility] and I will be able to order the xrays however I do not think I will be able to  order a STAT CT Head across systems, though I am willing to try. Advised her she may have to be seen at [facility] ER for this emergent imaging. She is agreeable to ordering through [facility] first.  - CT Head Wo Contrast; Future  2. Acute low back pain without sciatica, unspecified back pain laterality  - DG Cervical Spine Complete; Future - DG Thoracic Spine W/Swimmers; Future - DG Lumbar Spine Complete; Future    Return if symptoms worsen or fail to improve.      ITrinna Post, PA-C, have reviewed all documentation for this visit. The documentation on 04/17/20 for the exam, diagnosis, procedures, and orders are all accurate and complete.  The entirety of the information documented in the History of Present Illness, Review of Systems and Physical Exam were personally obtained by me. Portions of this information were initially documented by Wilburt Finlay, CMA and reviewed by me for thoroughness and accuracy.       Paulene Floor  [facility] [phone] (phone) [phone] (fax)  [place]

## 2020-04-17 ENCOUNTER — Ambulatory Visit (INDEPENDENT_AMBULATORY_CARE_PROVIDER_SITE_OTHER): Payer: Medicare Other | Admitting: Physician Assistant

## 2020-04-17 ENCOUNTER — Telehealth: Payer: Self-pay

## 2020-04-17 ENCOUNTER — Encounter: Payer: Self-pay | Admitting: Physician Assistant

## 2020-04-17 ENCOUNTER — Ambulatory Visit
Admission: RE | Admit: 2020-04-17 | Discharge: 2020-04-17 | Disposition: A | Discharge status: Home / Self Care | Payer: Medicare Other | Source: Ambulatory Visit | Attending: Physician Assistant | Admitting: Physician Assistant

## 2020-04-17 ENCOUNTER — Other Ambulatory Visit: Payer: Self-pay

## 2020-04-17 ENCOUNTER — Ambulatory Visit: Payer: Self-pay | Admitting: *Deleted

## 2020-04-17 VITALS — BP 143/96 | HR 89 | Temp 98.3°F | Ht 64.5 in | Wt 116.6 lb

## 2020-04-17 DIAGNOSIS — M545 Low back pain, unspecified: Secondary | ICD-10-CM

## 2020-04-17 DIAGNOSIS — S0990XA Unspecified injury of head, initial encounter: Secondary | ICD-10-CM | POA: Insufficient documentation

## 2020-04-17 DIAGNOSIS — S3992XA Unspecified injury of lower back, initial encounter: Secondary | ICD-10-CM | POA: Diagnosis not present

## 2020-04-17 DIAGNOSIS — S299XXA Unspecified injury of thorax, initial encounter: Secondary | ICD-10-CM | POA: Diagnosis not present

## 2020-04-17 DIAGNOSIS — S22000A Wedge compression fracture of unspecified thoracic vertebra, initial encounter for closed fracture: Secondary | ICD-10-CM

## 2020-04-17 DIAGNOSIS — S199XXA Unspecified injury of neck, initial encounter: Secondary | ICD-10-CM | POA: Diagnosis not present

## 2020-04-17 NOTE — Telephone Encounter (Signed)
Pt given  results per notes of Royetta Crochet, PA on 04/17/20. Pt verbalized understanding. Patient's daughter came on phone requesting xray results. No documentation noted form xray results from PA. Patient's daughter requesting results today. Patient's daughter reports patient is in severe pain from below her shoulders to her buttocks and can only lay down. Please advise.

## 2020-04-17 NOTE — Telephone Encounter (Signed)
Left message to call back  

## 2020-04-17 NOTE — Telephone Encounter (Signed)
Copied from [employer] [id number]. Topic: General - Other >> Apr 16, 2020  2:52 PM Wynetta Emery, [place] C wrote: Reason for CRM: pt would like to have a order for a bone density test placed at [facility] in [place]. Pt says that she already have an mammogram apt scheduled on 8/26 and would like to have it on the same day if possible.

## 2020-04-17 NOTE — Telephone Encounter (Signed)
-----   Message from Trinna Post, [place] sent at 04/17/2020  9:50 AM EDT ----- There were no acute abnormalities of her brain or skull, no signs of bleeding. The radiologist did include in the reading that there may have been an old stroke in the brain vs a dilated perivascular space. This is not a new finding but rather an old one. If it is an old stroke, there is nothing to do in the short term. Long term treatment would involve controlling BP, cholesterol, etc. If it is dilated perivascular space, this can be a normal finding in a certain amount of people. If there are no symptoms like headaches, tremors, difficulty walking, I wouldn't worry too much about it. If she has these symptoms or concerns, I am always happy to refer to neurology.   OK for PEC to advise. Left message for patient on her home phone to call back.

## 2020-04-17 NOTE — Telephone Encounter (Signed)
Copied from [place] [phone]. Topic: General - Call Back - No Documentation >> Apr 17, 2020  9:59 AM Sheran Luz wrote: Patient states that she is returning call to office. Unable to find documentation to know who called and the reason.

## 2020-04-18 ENCOUNTER — Other Ambulatory Visit: Payer: Self-pay

## 2020-04-18 DIAGNOSIS — M810 Age-related osteoporosis without current pathological fracture: Secondary | ICD-10-CM

## 2020-04-18 MED ORDER — HYDROCODONE-ACETAMINOPHEN 5-325 MG PO TABS: 0.5000 | ORAL_TABLET | Freq: Three times a day (TID) | ORAL | 0 refills | Status: AC

## 2020-04-18 NOTE — Telephone Encounter (Signed)
-----   Message from Trinna Post, [place] sent at 04/17/2020  4:43 PM EDT ----- There is "age indeterminate" compression fractures of her T spine and L spine. This means the radiologist can't tell for sure when this happened. However, this is where she reported pain and tenderness, so it could have happened during the fall. Would recommend good pain management and I am happy to send that in for her. If her pain is not improving in the next few weeks she should follow up in clinic.

## 2020-04-18 NOTE — Telephone Encounter (Signed)
Please advise 

## 2020-04-18 NOTE — Telephone Encounter (Signed)
Left message to advise as below. OK for PEC to advise patient.

## 2020-04-18 NOTE — Telephone Encounter (Signed)
OK to place. She is due for DEXA

## 2020-04-18 NOTE — Telephone Encounter (Signed)
I sent in pain medication. I would need a release to send the results. If her family would like to help her set up a MyChart they will be able to view the results that way. Otherwise can come sign a release.

## 2020-04-18 NOTE — Telephone Encounter (Signed)
Order placed

## 2020-04-18 NOTE — Telephone Encounter (Signed)
Advised patient as below. She would like medication sent to [facility] on [address]. She is also requesting that image reports be faxed to her chiropractor Johny Blamer III in [place].

## 2020-04-19 NOTE — Telephone Encounter (Signed)
Patient notified of message- she was able to get pain medication and is having some relief. She is able to walk and lay of side- sitting is still impossible. Patient was able to access MyChart- with son and see results. Patient is thankful for assistance and care.

## 2020-04-25 ENCOUNTER — Telehealth: Payer: Self-pay

## 2020-04-25 NOTE — Telephone Encounter (Signed)
Copied from [place] [id number]. Topic: General - Other >> Apr 25, 2020 11:48 AM Megan Woods wrote: PT daughter is concern about pt blood pressure 160 / 80 / please advise

## 2020-04-26 NOTE — Telephone Encounter (Signed)
Is this a single reading or a trend?  Any symptoms?  Could she come in for appt?

## 2020-04-26 NOTE — Telephone Encounter (Signed)
NA. Voicemail not set up.  

## 2020-04-29 NOTE — Telephone Encounter (Signed)
Scheduled for 05/03/2020

## 2020-05-02 DIAGNOSIS — Z1231 Encounter for screening mammogram for malignant neoplasm of breast: Secondary | ICD-10-CM | POA: Diagnosis not present

## 2020-05-02 DIAGNOSIS — Z13820 Encounter for screening for osteoporosis: Secondary | ICD-10-CM | POA: Diagnosis not present

## 2020-05-02 DIAGNOSIS — M810 Age-related osteoporosis without current pathological fracture: Secondary | ICD-10-CM | POA: Diagnosis not present

## 2020-05-02 DIAGNOSIS — E2839 Other primary ovarian failure: Secondary | ICD-10-CM | POA: Diagnosis not present

## 2020-05-02 DIAGNOSIS — Z780 Asymptomatic menopausal state: Secondary | ICD-10-CM | POA: Diagnosis not present

## 2020-05-02 DIAGNOSIS — N958 Other specified menopausal and perimenopausal disorders: Secondary | ICD-10-CM | POA: Diagnosis not present

## 2020-05-02 LAB — HM DEXA SCAN

## 2020-05-02 LAB — HM MAMMOGRAPHY

## 2020-05-03 ENCOUNTER — Other Ambulatory Visit: Payer: Self-pay

## 2020-05-03 ENCOUNTER — Ambulatory Visit (INDEPENDENT_AMBULATORY_CARE_PROVIDER_SITE_OTHER): Payer: Medicare Other | Admitting: Family Medicine

## 2020-05-03 ENCOUNTER — Encounter: Payer: Self-pay | Admitting: Family Medicine

## 2020-05-03 VITALS — BP 134/83 | HR 68 | Temp 98.7°F | Resp 16 | Ht 62.0 in | Wt 117.0 lb

## 2020-05-03 DIAGNOSIS — S32020D Wedge compression fracture of second lumbar vertebra, subsequent encounter for fracture with routine healing: Secondary | ICD-10-CM

## 2020-05-03 DIAGNOSIS — S22000A Wedge compression fracture of unspecified thoracic vertebra, initial encounter for closed fracture: Secondary | ICD-10-CM | POA: Diagnosis not present

## 2020-05-03 DIAGNOSIS — M810 Age-related osteoporosis without current pathological fracture: Secondary | ICD-10-CM | POA: Diagnosis not present

## 2020-05-03 DIAGNOSIS — I10 Essential (primary) hypertension: Secondary | ICD-10-CM | POA: Diagnosis not present

## 2020-05-03 DIAGNOSIS — E039 Hypothyroidism, unspecified: Secondary | ICD-10-CM | POA: Diagnosis not present

## 2020-05-03 DIAGNOSIS — S32020A Wedge compression fracture of second lumbar vertebra, initial encounter for closed fracture: Secondary | ICD-10-CM | POA: Insufficient documentation

## 2020-05-03 NOTE — Progress Notes (Signed)
Established patient visit   Patient: Megan Woods   DOB: 1935-01-01   85 y.o. Female  MRN: [id number] Visit Date: 05/03/2020  Today's healthcare provider: Lavon Paganini, MD   I,Sulibeya S Dimas,acting as a scribe for Lavon Paganini, MD.,have documented all relevant documentation on the behalf of Lavon Paganini, MD,as directed by  Lavon Paganini, MD while in the presence of Lavon Paganini, MD.  Chief Complaint  Patient presents with  . Hypertension   Subjective    HPI  01/15/2020 AWV-with Noah Delaine  Hypertension, follow-up  BP Readings from Last 3 Encounters:  05/03/20 134/83  04/17/20 (!) 143/96  02/07/20 (!) 141/87   Wt Readings from Last 3 Encounters:  05/03/20 117 lb (53.1 kg)  04/17/20 116 lb 9.6 oz (52.9 kg)  08/29/19 122 lb (55.3 kg)     She was last seen for hypertension 2 months ago.  BP at that visit was 141/87 . Management since that visit includes no changes.  She reports excellent compliance with treatment. She is not having side effects.  She is following a Regular diet. She is exercising. She does not smoke.  Use of agents associated with hypertension: none.   Outside blood pressures are stable. Symptoms: No chest pain No chest pressure  No palpitations No syncope  No dyspnea No orthopnea  No paroxysmal nocturnal dyspnea No lower extremity edema   Pertinent labs: Lab Results  Component Value Date   CHOL 185 08/29/2019   HDL 82 08/29/2019   LDLCALC 92 08/29/2019   TRIG 55 08/29/2019   CHOLHDL 2.3 08/29/2019   Lab Results  Component Value Date   NA 141 02/07/2020   K 3.7 02/07/2020   CREATININE 0.80 02/07/2020   GFRNONAA 69 02/07/2020   GFRAA 79 02/07/2020   GLUCOSE 112 (H) 02/07/2020     The ASCVD Risk score (Goff DC Jr., et al., 2013) failed to calculate for the following reasons:   The 2013 ASCVD risk score is only valid for ages 62 to 52    ---------------------------------------------------------------------------------------------------   Patient Active Problem List   Diagnosis Date Noted  . Compression fracture of body of thoracic vertebra ([place]) 05/03/2020  . Compression fracture of L2 lumbar vertebra ([place]) 05/03/2020  . Acute left-sided thoracic back pain 01/20/2019  . Essential hypertension 07/11/2018  . 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 Hypothyroidism   . Osteoporosis    Social History   Tobacco Use  . Smoking status: Never Smoker  . Smokeless tobacco: Never Used  Substance Use Topics  . Alcohol use: Never  . Drug use: Never   Allergies  Allergen Reactions  . Chocolate     Other reaction(s): other/intolerance  . Iodine     Other reaction(s): mild rash/itching  . Penicillins Swelling    Other reaction(s): mild rash/itching  . Shellfish Allergy     Other reaction(s): other/intolerance "I pass out"  . Sulfur     Other reaction(s): mild rash/itching       Medications: Outpatient Medications Prior to Visit  Medication Sig  . Calcium Carbonate (CALCIUM 600 PO) Take 600 mg by mouth daily.   . Cholecalciferol (VITAMIN D PO) Take 1,000 mg by mouth daily.  Marland Kitchen estradiol (VIVELLE-DOT) 0.0375 MG/24HR Place 1 patch onto the skin 2 (two) times a week.   . hydrochlorothiazide (HYDRODIURIL) 12.5 MG tablet Take 1 tablet (12.5 mg total) by mouth daily.  Marland Kitchen SYNTHROID 100 MCG tablet TAKE 1 TABLET BY MOUTH ONCE DAILY BEFORE BREAKFAST   No  facility-administered medications prior to visit.    Review of Systems    Objective    BP 134/83 (BP Location: Left Arm, Patient Position: Sitting, Cuff Size: Normal)   Pulse 68   Temp 98.7 F (37.1 C) (Oral)   Resp 16   Ht 5\' 2"  (1.575 m)   Wt 117 lb (53.1 kg)   BMI 21.40 kg/m  BP Readings from Last 3 Encounters:  05/03/20 134/83  04/17/20 (!) 143/96  02/07/20 (!) 141/87   Wt Readings from Last 3 Encounters:  05/03/20 117 lb (53.1 kg)  04/17/20 116 lb  9.6 oz (52.9 kg)  08/29/19 122 lb (55.3 kg)      Physical Exam Vitals reviewed.  Constitutional:      General: She is not in acute distress.    Appearance: Normal appearance. She is well-developed. She is not diaphoretic.  HENT:     Head: Normocephalic and atraumatic.     Right Ear: External ear normal.     Left Ear: External ear normal.  Eyes:     General: No scleral icterus.    Conjunctiva/sclera: Conjunctivae normal.  Neck:     Thyroid: No thyromegaly.  Cardiovascular:     Rate and Rhythm: Normal rate and regular rhythm.     Pulses: Normal pulses.     Heart sounds: Normal heart sounds. No murmur heard.   Pulmonary:     Effort: Pulmonary effort is normal. No respiratory distress.     Breath sounds: Normal breath sounds. No wheezing or rales.  Abdominal:     General: There is no distension.     Palpations: Abdomen is soft.     Tenderness: There is no abdominal tenderness.  Musculoskeletal:        General: No deformity.     Cervical back: Neck supple.     Right lower leg: No edema.     Left lower leg: No edema.  Lymphadenopathy:     Cervical: No cervical adenopathy.  Skin:    General: Skin is warm and dry.     Findings: No rash.  Neurological:     Mental Status: She is alert and oriented to person, place, and time. Mental status is at baseline.     Sensory: No sensory deficit.     Motor: No weakness.     Gait: Gait normal.  Psychiatric:        Mood and Affect: Mood normal.        Behavior: Behavior normal.        Thought Content: Thought content normal.      No results found for any visits on 05/03/20.  Assessment & Plan     Problem List Items Addressed This Visit      Cardiovascular and Mediastinum   Essential hypertension - Primary    Well controlled Continue current medications Recheck metabolic panel at next office visit - reviewed recent F/u in 6 months         Endocrine   Hypothyroidism    Well-controlled Reviewed recent TSH Continue  Synthroid at current dose Recheck TSH at follow-up        Musculoskeletal and Integument   Osteoporosis    Patient had DEXA done yesterday at [facility] imaging - will review when available Reviewed last DEXA from 04/06/2018 which showed lumbar spine T score of -3.6, left distal radius T- 3.0, left total proximal femur T score -2.3, right total proximal femur T score -2.2, left femoral neck T score -2.8, right femoral neck  T score -2.4.  Impression states that bone mineral density measures consistent with osteoporosis, compared to the preceding study, bone mineral density has decreased and it is also decreased compared to baseline study. Patient will continue calcium supplement She is not currently on osteoporosis treatment Further recommendations pending repeat DEXA scan       Compression fracture of body of thoracic vertebra (HCC)    Noted on x-ray about 1 month ago She has been resting and using OTC pain medications She is not interested in any surgical intervention and does not see the need to see orthopedics at this time We did discuss the importance of osteoporosis treatment pending DEXA results given these recent fractures      Compression fracture of L2 lumbar vertebra ([place])    Noted on x-ray about 1 month ago She has been resting and using OTC pain medications She is not interested in any surgical intervention and does not see the need to see orthopedics at this time We did discuss the importance of osteoporosis treatment pending DEXA results given these recent fractures          Return in about 6 months (around 11/03/2020) for chronic disease f/u.      I, Lavon Paganini, MD, have reviewed all documentation for this visit. The documentation on 05/03/20 for the exam, diagnosis, procedures, and orders are all accurate and complete.   Breylen Agyeman, Dionne Bucy, MD, MPH [facility]

## 2020-05-03 NOTE — Patient Instructions (Addendum)
Preventive Care 38 Years and Older, Female Preventive care refers to lifestyle choices and visits with your health care provider that can promote health and wellness. This includes:  A yearly physical exam. This is also called an annual well check.  Regular dental and eye exams.  Immunizations.  Screening for certain conditions.  Healthy lifestyle choices, such as diet and exercise. What can I expect for my preventive care visit? Physical exam Your health care provider will check:  Height and weight. These may be used to calculate body mass index (BMI), which is a measurement that tells if you are at a healthy weight.  Heart rate and blood pressure.  Your skin for abnormal spots. Counseling Your health care provider may ask you questions about:  Alcohol, tobacco, and drug use.  Emotional well-being.  Home and relationship well-being.  Sexual activity.  Eating habits.  History of falls.  Memory and ability to understand (cognition).  Work and work Statistician.  Pregnancy and menstrual history. What immunizations do I need?  Influenza (flu) vaccine  This is recommended every year. Tetanus, diphtheria, and pertussis (Tdap) vaccine  You may need a Td booster every 10 years. Varicella (chickenpox) vaccine  You may need this vaccine if you have not already been vaccinated. Zoster (shingles) vaccine  You may need this after age 33. Pneumococcal conjugate (PCV13) vaccine  One dose is recommended after age 33. Pneumococcal polysaccharide (PPSV23) vaccine  One dose is recommended after age 72. Measles, mumps, and rubella (MMR) vaccine  You may need at least one dose of MMR if you were born in 1957 or later. You may also need a second dose. Meningococcal conjugate (MenACWY) vaccine  You may need this if you have certain conditions. Hepatitis A vaccine  You may need this if you have certain conditions or if you travel or work in places where you may be exposed  to hepatitis A. Hepatitis B vaccine  You may need this if you have certain conditions or if you travel or work in places where you may be exposed to hepatitis B. Haemophilus influenzae type b (Hib) vaccine  You may need this if you have certain conditions. You may receive vaccines as individual doses or as more than one vaccine together in one shot (combination vaccines). Talk with your health care provider about the risks and benefits of combination vaccines. What tests do I need? Blood tests  Lipid and cholesterol levels. These may be checked every 5 years, or more frequently depending on your overall health.  Hepatitis C test.  Hepatitis B test. Screening  Lung cancer screening. You may have this screening every year starting at age 39 if you have a 30-pack-year history of smoking and currently smoke or have quit within the past 15 years.  Colorectal cancer screening. All adults should have this screening starting at age 36 and continuing until age 15. Your health care provider may recommend screening at age 23 if you are at increased risk. You will have tests every 1-10 years, depending on your results and the type of screening test.  Diabetes screening. This is done by checking your blood sugar (glucose) after you have not eaten for a while (fasting). You may have this done every 1-3 years.  Mammogram. This may be done every 1-2 years. Talk with your health care provider about how often you should have regular mammograms.  BRCA-related cancer screening. This may be done if you have a family history of breast, ovarian, tubal, or peritoneal cancers.  Other tests  Sexually transmitted disease (STD) testing.  Bone density scan. This is done to screen for osteoporosis. You may have this done starting at age 44. Follow these instructions at home: Eating and drinking  Eat a diet that includes fresh fruits and vegetables, whole grains, lean protein, and low-fat dairy products. Limit  your intake of foods with high amounts of sugar, saturated fats, and salt.  Take vitamin and mineral supplements as recommended by your health care provider.  Do not drink alcohol if your health care provider tells you not to drink.  If you drink alcohol: ? Limit how much you have to 0-1 drink a day. ? Be aware of how much alcohol is in your drink. In the [place] one drink equals one 12 oz bottle of beer (355 mL), one 5 oz glass of wine (148 mL), or one 1 oz glass of hard liquor (44 mL). Lifestyle  Take daily care of your teeth and gums.  Stay active. Exercise for at least 30 minutes on 5 or more days each week.  Do not use any products that contain nicotine or tobacco, such as cigarettes, e-cigarettes, and chewing tobacco. If you need help quitting, ask your health care provider.  If you are sexually active, practice safe sex. Use a condom or other form of protection in order to prevent STIs (sexually transmitted infections).  Talk with your health care provider about taking a low-dose aspirin or statin. What's next?  Go to your health care provider once a year for a well check visit.  Ask your health care provider how often you should have your eyes and teeth checked.  Stay up to date on all vaccines. This information is not intended to replace advice given to you by your health care provider. Make sure you discuss any questions you have with your health care provider. Document Revised: 08/18/2018 Document Reviewed: 08/18/2018 Elsevier Patient Education  2020 [employer].

## 2020-05-03 NOTE — Assessment & Plan Note (Signed)
Noted on x-ray about 1 month ago She has been resting and using OTC pain medications She is not interested in any surgical intervention and does not see the need to see orthopedics at this time We did discuss the importance of osteoporosis treatment pending DEXA results given these recent fractures

## 2020-05-03 NOTE — Assessment & Plan Note (Signed)
Well-controlled Reviewed recent TSH Continue Synthroid at current dose Recheck TSH at follow-up

## 2020-05-03 NOTE — Assessment & Plan Note (Addendum)
Well controlled Continue current medications Recheck metabolic panel at next office visit - reviewed recent F/u in 6 months

## 2020-05-03 NOTE — Assessment & Plan Note (Addendum)
Patient had DEXA done yesterday at [facility] imaging - will review when available Reviewed last DEXA from 04/06/2018 which showed lumbar spine T score of -3.6, left distal radius T- 3.0, left total proximal femur T score -2.3, right total proximal femur T score -2.2, left femoral neck T score -2.8, right femoral neck T score -2.4.  Impression states that bone mineral density measures consistent with osteoporosis, compared to the preceding study, bone mineral density has decreased and it is also decreased compared to baseline study. Patient will continue calcium supplement She is not currently on osteoporosis treatment Further recommendations pending repeat DEXA scan

## 2020-05-15 ENCOUNTER — Encounter: Payer: Self-pay | Admitting: Family Medicine

## 2020-08-08 DIAGNOSIS — H353232 Exudative age-related macular degeneration, bilateral, with inactive choroidal neovascularization: Secondary | ICD-10-CM | POA: Diagnosis not present

## 2020-08-20 ENCOUNTER — Other Ambulatory Visit: Payer: Self-pay

## 2020-08-20 ENCOUNTER — Encounter: Payer: Self-pay | Admitting: Family Medicine

## 2020-08-20 ENCOUNTER — Ambulatory Visit (INDEPENDENT_AMBULATORY_CARE_PROVIDER_SITE_OTHER): Payer: Medicare Other | Admitting: Family Medicine

## 2020-08-20 VITALS — BP 137/86 | HR 74 | Temp 98.5°F | Resp 16 | Wt 119.9 lb

## 2020-08-20 DIAGNOSIS — E039 Hypothyroidism, unspecified: Secondary | ICD-10-CM | POA: Diagnosis not present

## 2020-08-20 DIAGNOSIS — I10 Essential (primary) hypertension: Secondary | ICD-10-CM | POA: Diagnosis not present

## 2020-08-20 DIAGNOSIS — F4321 Adjustment disorder with depressed mood: Secondary | ICD-10-CM | POA: Diagnosis not present

## 2020-08-20 NOTE — Assessment & Plan Note (Signed)
Previously well controlled Continue Synthroid at current dose  Recheck TSH and adjust Synthroid as indicated   

## 2020-08-20 NOTE — Progress Notes (Signed)
Established patient visit   Patient: Megan Woods   DOB: 10/04/1934   85 y.o. Female  MRN: [id number] Visit Date: 08/20/2020  Today's healthcare provider: Lavon Paganini, MD   Chief Complaint  Patient presents with  . Hypertension  . Hypothyroidism   Subjective    HPI  Hypertension, follow-up  BP Readings from Last 3 Encounters:  08/20/20 137/86  05/03/20 134/83  04/17/20 (!) 143/96   Wt Readings from Last 3 Encounters:  08/20/20 119 lb 14.4 oz (54.4 kg)  05/03/20 117 lb (53.1 kg)  04/17/20 116 lb 9.6 oz (52.9 kg)     She was last seen for hypertension 4 months ago.  BP at that visit was 134/83. Management since that visit includes no changes.  She reports good compliance with treatment. She is not having side effects.  She is following a Regular diet. She is not exercising. She does not smoke.  Use of agents associated with hypertension: none.   Outside blood pressures are checked occasionally. Symptoms: No chest pain No chest pressure  No palpitations No syncope  No dyspnea No orthopnea  No paroxysmal nocturnal dyspnea No lower extremity edema   Pertinent labs: Lab Results  Component Value Date   CHOL 185 08/29/2019   HDL 82 08/29/2019   LDLCALC 92 08/29/2019   TRIG 55 08/29/2019   CHOLHDL 2.3 08/29/2019   Lab Results  Component Value Date   NA 141 02/07/2020   K 3.7 02/07/2020   CREATININE 0.80 02/07/2020   GFRNONAA 69 02/07/2020   GFRAA 79 02/07/2020   GLUCOSE 112 (H) 02/07/2020     The ASCVD Risk score (Goff DC Jr., et al., 2013) failed to calculate for the following reasons:   The 2013 ASCVD risk score is only valid for ages 8 to 51    Hypothyroid, follow-up  Lab Results  Component Value Date   TSH 0.500 02/07/2020   TSH 0.747 08/29/2019   TSH 2.110 02/17/2019   Wt Readings from Last 3 Encounters:  08/20/20 119 lb 14.4 oz (54.4 kg)  05/03/20 117 lb (53.1 kg)  04/17/20 116 lb 9.6 oz (52.9 kg)    She was last seen for  hypothyroid 4 months ago.  Management since that visit includes no changes. She reports good compliance with treatment. She is not having side effects.   Symptoms: No change in energy level No constipation  No diarrhea No heat / cold intolerance  No nervousness No palpitations  No weight changes     Lost her husband November 2nd 2021.      Medications: Outpatient Medications Prior to Visit  Medication Sig  . Calcium Carbonate (CALCIUM 600 PO) Take 600 mg by mouth daily.   . Cholecalciferol (VITAMIN D PO) Take 1,000 mg by mouth daily.  Marland Kitchen estradiol (VIVELLE-DOT) 0.0375 MG/24HR Place 1 patch onto the skin 2 (two) times a week.   . hydrochlorothiazide (HYDRODIURIL) 12.5 MG tablet Take 1 tablet (12.5 mg total) by mouth daily.  Marland Kitchen SYNTHROID 100 MCG tablet TAKE 1 TABLET BY MOUTH ONCE DAILY BEFORE BREAKFAST   No facility-administered medications prior to visit.    Review of Systems  Constitutional: Negative.   Respiratory: Negative for cough and shortness of breath.   Cardiovascular: Negative for chest pain, palpitations and leg swelling.  Endocrine: Negative.   Musculoskeletal: Positive for arthralgias.  Neurological: Negative for dizziness, light-headedness and headaches.      Objective    BP 137/86   Pulse 74  Temp 98.5 F (36.9 C)   Resp 16   Wt 119 lb 14.4 oz (54.4 kg)   BMI 21.93 kg/m    Physical Exam Vitals reviewed.  Constitutional:      General: She is not in acute distress.    Appearance: Normal appearance. She is well-developed. She is not diaphoretic.  HENT:     Head: Normocephalic and atraumatic.  Eyes:     General: No scleral icterus.    Conjunctiva/sclera: Conjunctivae normal.  Neck:     Thyroid: No thyromegaly.  Cardiovascular:     Rate and Rhythm: Normal rate and regular rhythm.     Pulses: Normal pulses.     Heart sounds: Normal heart sounds. No murmur heard.   Pulmonary:     Effort: Pulmonary effort is normal. No respiratory distress.      Breath sounds: Normal breath sounds. No wheezing, rhonchi or rales.  Musculoskeletal:     Cervical back: Neck supple.     Right lower leg: No edema.     Left lower leg: No edema.  Lymphadenopathy:     Cervical: No cervical adenopathy.  Skin:    General: Skin is warm and dry.     Findings: No rash.  Neurological:     Mental Status: She is alert and oriented to person, place, and time. Mental status is at baseline.  Psychiatric:        Mood and Affect: Mood normal.        Behavior: Behavior normal.       No results found for any visits on 08/20/20.  Assessment & Plan     Problem List Items Addressed This Visit      Cardiovascular and Mediastinum   Essential hypertension - Primary    Well controlled Continue current medications Recheck metabolic panel F/u in 6 months       Relevant Orders   Lipid panel   Comprehensive metabolic panel     Endocrine   Hypothyroidism    Previously well controlled Continue Synthroid at current dose  Recheck TSH and adjust Synthroid as indicated        Relevant Orders   TSH     Other   Grieving    Discussed death of her husband from metastatic prostate cancer She is coping well Has had minimal insomnia          Return in about 6 months (around 02/18/2021) for AWV, chronic disease f/u.      I, Lavon Paganini, MD, have reviewed all documentation for this visit. The documentation on 08/20/20 for the exam, diagnosis, procedures, and orders are all accurate and complete.   Megan Woods, Megan Bucy, MD, MPH [facility]

## 2020-08-20 NOTE — Assessment & Plan Note (Signed)
Discussed death of her husband from metastatic prostate cancer She is coping well Has had minimal insomnia

## 2020-08-20 NOTE — Assessment & Plan Note (Signed)
Well controlled Continue current medications Recheck metabolic panel F/u in 6 months  

## 2020-08-21 LAB — COMPREHENSIVE METABOLIC PANEL
ALT: 11 IU/L (ref 0–32)
AST: 18 IU/L (ref 0–40)
Albumin/Globulin Ratio: 2.4 — ABNORMAL HIGH (ref 1.2–2.2)
Albumin: 4.3 g/dL (ref 3.6–4.6)
Alkaline Phosphatase: 73 IU/L (ref 44–121)
BUN/Creatinine Ratio: 18 (ref 12–28)
BUN: 16 mg/dL (ref 8–27)
Bilirubin Total: 0.3 mg/dL (ref 0.0–1.2)
CO2: 25 mmol/L (ref 20–29)
Calcium: 9.3 mg/dL (ref 8.7–10.3)
Chloride: 102 mmol/L (ref 96–106)
Creatinine, Ser: 0.91 mg/dL (ref 0.57–1.00)
GFR calc Af Amer: 67 mL/min/{1.73_m2} (ref >59)
GFR calc non Af Amer: 59 mL/min/{1.73_m2} — ABNORMAL LOW (ref >59)
Globulin, Total: 1.8 g/dL (ref 1.5–4.5)
Glucose: 90 mg/dL (ref 65–99)
Potassium: 3.8 mmol/L (ref 3.5–5.2)
Sodium: 140 mmol/L (ref 134–144)
Total Protein: 6.1 g/dL (ref 6.0–8.5)

## 2020-08-21 LAB — LIPID PANEL
Chol/HDL Ratio: 2.6 ratio (ref 0.0–4.4)
Cholesterol, Total: 204 mg/dL — ABNORMAL HIGH (ref 100–199)
HDL: 80 mg/dL (ref >39)
LDL Chol Calc (NIH): 108 mg/dL — ABNORMAL HIGH (ref 0–99)
Triglycerides: 90 mg/dL (ref 0–149)
VLDL Cholesterol Cal: 16 mg/dL (ref 5–40)

## 2020-08-21 LAB — TSH: TSH: 1.06 u[IU]/mL (ref 0.450–4.500)

## 2020-08-26 ENCOUNTER — Other Ambulatory Visit: Payer: Self-pay | Admitting: Family Medicine

## 2020-08-26 MED ORDER — SYNTHROID 100 MCG PO TABS: ORAL_TABLET | ORAL | 1 refills | Status: DC

## 2020-08-26 MED ORDER — HYDROCHLOROTHIAZIDE 12.5 MG PO TABS: 12.5000 mg | ORAL_TABLET | Freq: Every day | ORAL | 2 refills | Status: DC

## 2020-08-26 NOTE — Telephone Encounter (Signed)
Medication: hydrochlorothiazide (HYDRODIURIL) 12.5 MG tablet [[id number]] , SYNTHROID 100 MCG tablet [[id number]]   Has the patient contacted their pharmacy? YES  (Agent: If no, request that the patient contact the pharmacy for the refill.) (Agent: If yes, when and what did the pharmacy advise?)  Preferred Pharmacy (with phone number or street name): [place], [place] - [address] [place] [address] Phone: [phone] Fax: [phone] Hours: Not open 24 hours    Agent: Please be advised that RX refills may take up to 3 business days. We ask that you follow-up with your pharmacy.

## 2020-08-29 DIAGNOSIS — H6123 Impacted cerumen, bilateral: Secondary | ICD-10-CM | POA: Diagnosis not present

## 2020-08-29 DIAGNOSIS — H903 Sensorineural hearing loss, bilateral: Secondary | ICD-10-CM | POA: Diagnosis not present

## 2020-09-30 DIAGNOSIS — H353112 Nonexudative age-related macular degeneration, right eye, intermediate dry stage: Secondary | ICD-10-CM | POA: Diagnosis not present

## 2020-09-30 DIAGNOSIS — H353222 Exudative age-related macular degeneration, left eye, with inactive choroidal neovascularization: Secondary | ICD-10-CM | POA: Diagnosis not present

## 2020-10-21 ENCOUNTER — Ambulatory Visit: Payer: Medicare Other | Admitting: Family Medicine

## 2021-01-21 ENCOUNTER — Encounter: Payer: Self-pay | Admitting: Family Medicine

## 2021-01-21 ENCOUNTER — Other Ambulatory Visit: Payer: Self-pay

## 2021-01-21 ENCOUNTER — Ambulatory Visit (INDEPENDENT_AMBULATORY_CARE_PROVIDER_SITE_OTHER): Payer: Medicare Other | Admitting: Family Medicine

## 2021-01-21 VITALS — BP 135/85 | HR 64 | Temp 97.8°F | Resp 16 | Ht 64.0 in | Wt 120.0 lb

## 2021-01-21 DIAGNOSIS — M810 Age-related osteoporosis without current pathological fracture: Secondary | ICD-10-CM

## 2021-01-21 DIAGNOSIS — I10 Essential (primary) hypertension: Secondary | ICD-10-CM | POA: Diagnosis not present

## 2021-01-21 DIAGNOSIS — E039 Hypothyroidism, unspecified: Secondary | ICD-10-CM | POA: Diagnosis not present

## 2021-01-21 DIAGNOSIS — H353190 Nonexudative age-related macular degeneration, unspecified eye, stage unspecified: Secondary | ICD-10-CM

## 2021-01-21 NOTE — Assessment & Plan Note (Signed)
Previously well controlled Continue Synthroid at current dose  Recheck TSH and adjust Synthroid as indicated   

## 2021-01-21 NOTE — Assessment & Plan Note (Signed)
Recheck Vit D level Continue supplements and exercise

## 2021-01-21 NOTE — Progress Notes (Signed)
Established patient visit   Patient: Megan Woods   DOB: 05/24/1935   86 y.o. Female  MRN: [id number] Visit Date: 01/21/2021  Today's healthcare provider: Lavon Paganini, MD   Chief Complaint  Patient presents with  . Hypertension  . Hypothyroidism   Subjective    Hypertension Pertinent negatives include no chest pain, headaches, neck pain, palpitations or shortness of breath.   She occasionally checks her blood pressure at home and typically is in the 120s to 130s. She has been utilizing [facility] for a few years for screenings to monitor risks for strokes and cardiovascular disease.   Lifestyle She is feeling well today. She says that she is going through some emotional changes but she has a good support group.    Hypertension, follow-up  BP Readings from Last 3 Encounters:  01/21/21 135/85  08/20/20 137/86  05/03/20 134/83   Wt Readings from Last 3 Encounters:  01/21/21 120 lb (54.4 kg)  08/20/20 119 lb 14.4 oz (54.4 kg)  05/03/20 117 lb (53.1 kg)     She was last seen for hypertension 5 months ago.  BP at that visit was 137/86. Management since that visit includes none.  She reports excellent compliance with treatment. She is not having side effects. She is following a Low Sodium diet. She is exercising. She does not smoke.  Use of agents associated with hypertension: none.   Outside blood pressures are stable. Symptoms: No chest pain No chest pressure  No palpitations No syncope  No dyspnea No orthopnea  No paroxysmal nocturnal dyspnea No lower extremity edema   Pertinent labs: Lab Results  Component Value Date   CHOL 204 (H) 08/20/2020   HDL 80 08/20/2020   LDLCALC 108 (H) 08/20/2020   TRIG 90 08/20/2020   CHOLHDL 2.6 08/20/2020   Lab Results  Component Value Date   NA 140 08/20/2020   K 3.8 08/20/2020   CREATININE 0.91 08/20/2020   GFRNONAA 59 (L) 08/20/2020   GFRAA 67 08/20/2020   GLUCOSE 90 08/20/2020     The ASCVD Risk score  (Goff DC Jr., et al., 2013) failed to calculate for the following reasons:   The 2013 ASCVD risk score is only valid for ages 11 to 65   --------------------------------------------------------------------------------------------------- Hypothyroid, follow-up  Lab Results  Component Value Date   TSH 1.060 08/20/2020   TSH 0.500 02/07/2020   TSH 0.747 08/29/2019   Wt Readings from Last 3 Encounters:  01/21/21 120 lb (54.4 kg)  08/20/20 119 lb 14.4 oz (54.4 kg)  05/03/20 117 lb (53.1 kg)    She was last seen for hypothyroid 5 months ago.  Management since that visit includes none. She reports excellent compliance with treatment. She is not having side effects.  Symptoms: No change in energy level No constipation  No diarrhea No heat / cold intolerance  No nervousness No palpitations  No weight changes    ----------------------------------------------------------------------------------------- Patient Active Problem List   Diagnosis Date Noted  . Grieving 08/20/2020  . Compression fracture of body of thoracic vertebra ([place]) 05/03/2020  . Compression fracture of L2 lumbar vertebra ([place]) 05/03/2020  . Acute left-sided thoracic back pain 01/20/2019  . Essential hypertension 07/11/2018  . 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 Hypothyroidism   . Osteoporosis    Past Medical History:  Diagnosis Date  . Allergy   . Arthritis   . 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 Hypertension   . Hypothyroidism   . Mitral valve  disorder   . Osteoporosis    Allergies  Allergen Reactions  . Chocolate     Other reaction(s): other/intolerance  . Elemental Sulfur     Other reaction(s): mild rash/itching  . Iodine     Other reaction(s): mild rash/itching  . Penicillins Swelling    Other reaction(s): mild rash/itching  . Shellfish Allergy     Other reaction(s): other/intolerance "I pass out"       Medications: Outpatient Medications Prior to Visit  Medication Sig  .  Calcium Carbonate (CALCIUM 600 PO) Take 600 mg by mouth daily.   . Cholecalciferol (VITAMIN D PO) Take 1,000 mg by mouth daily.  Marland Kitchen estradiol (VIVELLE-DOT) 0.0375 MG/24HR Place 1 patch onto the skin 2 (two) times a week.   . hydrochlorothiazide (HYDRODIURIL) 12.5 MG tablet Take 1 tablet (12.5 mg total) by mouth daily.  Marland Kitchen SYNTHROID 100 MCG tablet TAKE 1 TABLET BY MOUTH ONCE DAILY BEFORE BREAKFAST   No facility-administered medications prior to visit.    Review of Systems  Constitutional: Negative for activity change, appetite change, fatigue and fever.  HENT: Positive for hearing loss. Negative for ear pain, nosebleeds, sinus pressure, sinus pain and sore throat.   Eyes: Negative for pain and visual disturbance.  Respiratory: Negative for chest tightness, shortness of breath and wheezing.   Cardiovascular: Negative for chest pain and palpitations.  Gastrointestinal: Negative.  Negative for abdominal pain, blood in stool, diarrhea, nausea and vomiting.  Endocrine: Negative for cold intolerance and heat intolerance.  Genitourinary: Positive for frequency. Negative for dysuria, flank pain, pelvic pain and urgency.  Musculoskeletal: Negative for back pain, neck pain and neck stiffness.  Neurological: Negative for dizziness, syncope, weakness, light-headedness, numbness and headaches.       Objective    BP 135/85 Comment: home reading  Pulse 64   Temp 97.8 F (36.6 C) (Oral)   Resp 16   Ht 5\' 4"  (1.626 m)   Wt 120 lb (54.4 kg)   SpO2 99%   BMI 20.60 kg/m  BP Readings from Last 3 Encounters:  01/21/21 135/85  08/20/20 137/86  05/03/20 134/83   Wt Readings from Last 3 Encounters:  01/21/21 120 lb (54.4 kg)  08/20/20 119 lb 14.4 oz (54.4 kg)  05/03/20 117 lb (53.1 kg)       Physical Exam Vitals reviewed.  Constitutional:      General: She is not in acute distress.    Appearance: Normal appearance. She is well-developed. She is not diaphoretic.  HENT:     Head: Normocephalic  and atraumatic.  Eyes:     General: No scleral icterus.    Conjunctiva/sclera: Conjunctivae normal.  Neck:     Thyroid: No thyromegaly.  Cardiovascular:     Rate and Rhythm: Normal rate and regular rhythm.     Pulses: Normal pulses.     Heart sounds: Normal heart sounds. No murmur heard.   Pulmonary:     Effort: Pulmonary effort is normal. No respiratory distress.     Breath sounds: Normal breath sounds. No wheezing, rhonchi or rales.  Musculoskeletal:     Cervical back: Neck supple.     Right lower leg: No edema.     Left lower leg: No edema.  Lymphadenopathy:     Cervical: No cervical adenopathy.  Skin:    General: Skin is warm and dry.     Findings: No rash.  Neurological:     Mental Status: She is alert and oriented to person, place, and time. Mental  status is at baseline.  Psychiatric:        Mood and Affect: Mood normal.        Behavior: Behavior normal.       No results found for any visits on 01/21/21.  Assessment & Plan     Problem List Items Addressed This Visit      Cardiovascular and Mediastinum   Essential hypertension - Primary    Well controlled Continue current medications Recheck metabolic panel F/u in 6 months       Relevant Orders   Basic Metabolic Panel (BMET)     Endocrine   Hypothyroidism    Previously well controlled Continue Synthroid at current dose  Recheck TSH and adjust Synthroid as indicated        Relevant Orders   TSH     Musculoskeletal and Integument   Osteoporosis    Recheck Vit D level Continue supplements and exercise      Relevant Orders   VITAMIN D 25 Hydroxy (Vit-D Deficiency, Fractures)     Other   Dry age-related macular degeneration    Stable Followed by Optho          Return in about 6 months (around 07/24/2021) for AWV, chronic disease f/u.       I,Essence Turner,acting as a Education administrator for Lavon Paganini, MD.,have documented all relevant documentation on the behalf of Lavon Paganini,  MD,as directed by  Lavon Paganini, MD while in the presence of Lavon Paganini, MD.  I, Lavon Paganini, MD, have reviewed all documentation for this visit. The documentation on 01/21/21 for the exam, diagnosis, procedures, and orders are all accurate and complete.   Bernis Stecher, Dionne Bucy, MD, MPH [facility]

## 2021-01-21 NOTE — Assessment & Plan Note (Signed)
Stable Followed by [place]

## 2021-01-21 NOTE — Assessment & Plan Note (Signed)
Well controlled Continue current medications Recheck metabolic panel F/u in 6 months  

## 2021-03-15 ENCOUNTER — Other Ambulatory Visit: Payer: Self-pay | Admitting: Family Medicine

## 2021-03-15 NOTE — Telephone Encounter (Signed)
Future OV 08/21/21. Most recent TSH 08/20/21 1.060 WNL Active order for TSH pending. Approved per protocol. Requested Prescriptions  Pending Prescriptions Disp Refills  . SYNTHROID 100 MCG tablet [Pharmacy Med Name: Synthroid 100 MCG Oral Tablet] 90 tablet 0    Sig: TAKE 1 TABLET BY MOUTH ONCE DAILY BEFORE BREAKFAST     Endocrinology:  Hypothyroid Agents Failed - 03/15/2021 10:24 AM      Failed - TSH needs to be rechecked within 3 months after an abnormal result. Refill until TSH is due.      Passed - TSH in normal range and within 360 days    TSH  Date Value Ref Range Status  08/20/2020 1.060 0.450 - 4.500 uIU/mL Final         Passed - Valid encounter within last 12 months    Recent Outpatient Visits          1 month ago Essential hypertension   [place], Dionne Bucy, MD   6 months ago Essential hypertension   [place], Dionne Bucy, MD   10 months ago Essential hypertension   [place], MD   11 months ago Injury of head, initial encounter   [place], Wendee Beavers, [place]   1 year ago Essential hypertension   [facility] [place], Dionne Bucy, MD

## 2021-04-17 DIAGNOSIS — Z01419 Encounter for gynecological examination (general) (routine) without abnormal findings: Secondary | ICD-10-CM | POA: Diagnosis not present

## 2021-04-17 DIAGNOSIS — M810 Age-related osteoporosis without current pathological fracture: Secondary | ICD-10-CM | POA: Diagnosis not present

## 2021-05-06 DIAGNOSIS — Z1231 Encounter for screening mammogram for malignant neoplasm of breast: Secondary | ICD-10-CM | POA: Diagnosis not present

## 2021-05-07 DIAGNOSIS — R1310 Dysphagia, unspecified: Secondary | ICD-10-CM | POA: Diagnosis not present

## 2021-05-07 DIAGNOSIS — H903 Sensorineural hearing loss, bilateral: Secondary | ICD-10-CM | POA: Diagnosis not present

## 2021-05-08 ENCOUNTER — Other Ambulatory Visit: Payer: Self-pay | Admitting: Unknown Physician Specialty

## 2021-05-08 DIAGNOSIS — R633 Feeding difficulties, unspecified: Secondary | ICD-10-CM

## 2021-05-08 DIAGNOSIS — R490 Dysphonia: Secondary | ICD-10-CM

## 2021-05-08 DIAGNOSIS — R1310 Dysphagia, unspecified: Secondary | ICD-10-CM

## 2021-06-06 DIAGNOSIS — H353111 Nonexudative age-related macular degeneration, right eye, early dry stage: Secondary | ICD-10-CM | POA: Diagnosis not present

## 2021-06-06 DIAGNOSIS — H353222 Exudative age-related macular degeneration, left eye, with inactive choroidal neovascularization: Secondary | ICD-10-CM | POA: Diagnosis not present

## 2021-06-09 ENCOUNTER — Ambulatory Visit
Admission: RE | Admit: 2021-06-09 | Discharge: 2021-06-09 | Disposition: A | Discharge status: Home / Self Care | Payer: Medicare Other | Source: Ambulatory Visit | Attending: Unknown Physician Specialty | Admitting: Unknown Physician Specialty

## 2021-06-09 ENCOUNTER — Other Ambulatory Visit: Payer: Self-pay

## 2021-06-09 DIAGNOSIS — R490 Dysphonia: Secondary | ICD-10-CM

## 2021-06-09 DIAGNOSIS — R633 Feeding difficulties, unspecified: Secondary | ICD-10-CM | POA: Diagnosis not present

## 2021-06-09 DIAGNOSIS — R1310 Dysphagia, unspecified: Secondary | ICD-10-CM | POA: Diagnosis not present

## 2021-06-09 NOTE — Therapy (Signed)
[place], [place], [address] Phone: [phone]   Fax:     Modified Barium Swallow  Patient Details  Name: Megan Woods MRN: [id number] Date of Birth: 02-01-1935 No data recorded  Encounter Date: 06/09/2021   End of Session - 06/09/21 1358     Visit Number 1    Number of Visits 1    Date for SLP Re-Evaluation 06/09/21    SLP Start Time 6387    SLP Stop Time  1310    SLP Time Calculation (min) 25 min    Activity Tolerance Patient tolerated treatment well             There were no vitals filed for this visit.   Subjective Assessment - 06/09/21 1331     Subjective Reports she feels her "throat closes up" if she eats or drinks something cold. Liquids are worse    Currently in Pain? No/denies              Objective Swallowing Evaluation: Type of Study: MBS-Modified Barium Swallow Study   Patient Details  Name: Megan Woods MRN: [id number] Date of Birth: 11-24-1934  Today's Date: 06/09/2021 Time: SLP Start Time (ACUTE ONLY): 1245 -SLP Stop Time (ACUTE ONLY): 1310  SLP Time Calculation (min) (ACUTE ONLY): 25 min   Past Medical History:  Past Medical History:  Diagnosis Date   Allergy    Arthritis    Dry age-related macular degeneration    Hypertension    Hypothyroidism    Mitral valve disorder    Osteoporosis    Past Surgical History:  Past Surgical History:  Procedure Laterality Date   ABDOMINAL HYSTERECTOMY  1975   also took out L ovary and cervix   APPENDECTOMY     CATARACT EXTRACTION     OOPHORECTOMY     R ovary removed many years after hysterectomy for benign cyst   SMALL BOWEL REPAIR     perforated during oophorectomy   TONSILLECTOMY     HPI: Patient is an 86 y.o. female with past medical history noted for HTN, hypothyroidism, hearing loss, referred by Dr. Beverly Gust for MBS who reports her throat "closes down" when she drinks something cold.  Laryngoscopy on 05/07/21 was normal.   Subjective: pleasant, cooperative    Assessment / Plan / Recommendation  CHL IP CLINICAL IMPRESSIONS 06/09/2021  Clinical Impression Patient presents with functional oropharyngeal swallow and mild pharyngoesophageal dysphagia. Oral stage is characterized by adequate lip closure, bolus preparation, and anterior to posterior transit. Swallow initiation is timely at the level of the valleculae (delay to pyriforms with mixed consistency of thin/barium pill). Pharyngeal stage is noted for minimally reduced tongue base retraction, hyolaryngeal excursion, and pharyngeal constriction. Epiglottic deflection is complete; there is no penetration or aspiration. Pharyngeal stripping wave is complete; pt clears intermittent, trace residue from base of tongue/valleculae with second swallow without cues. Amplitude/duration of cricopharyngeus opening is WFL, however there is retrograde bolus flow in the cervical esophagus with all consistencies, which intermittently backflows through the pharyngoesophageal segment and is retained in the post-cricoid region. Reduces with subsequent swallows. An esophageal sweep was performed in the upright position with 13 mm barium pill and sequential boluses of thin liquid, and was noted for reflux per the radiologist. Barium/retrograde flow was noted in the mid-thoracic esophagus. Consistencies tested were thin liquids x2 tsps, 1 cup sip, and 3 sequential cup sips, nectar x1 cup sip, puree x2 heaping tsps, mechanical soft (1/4 graham  cracker in applesauce x2), and 13 mm barium tablet. SLP educated on reflux modifications and provided handout with this information. Patient would benefit from GI referral/ further assessment of esophageal function (esophagram). Recommend patient continue regular diet with thin liquids; no further ST indicated at this time.  SLP Visit Diagnosis Dysphagia, pharyngoesophageal phase (R13.14)  Impact on safety and function  Mild aspiration risk      CHL IP TREATMENT RECOMMENDATION 06/09/2021  Treatment Recommendations No treatment recommended at this time     No flowsheet data found.  CHL IP DIET RECOMMENDATION 06/09/2021  SLP Diet Recommendations Regular solids;Thin liquid  Liquid Administration via Cup  Medication Administration Whole meds with liquid  Compensations Slow rate;Small sips/bites;Follow solids with liquid;Multiple dry swallows after each bite/sip  Postural Changes --      CHL IP OTHER RECOMMENDATIONS 06/09/2021  Recommended Consults Consider GI evaluation;Consider esophageal assessment  Oral Care Recommendations Oral care BID  Other Recommendations --      CHL IP FOLLOW UP RECOMMENDATIONS 06/09/2021  Follow up Recommendations None      No flowsheet data found.         CHL IP ORAL PHASE 06/09/2021  Oral Phase WFL  Oral - Pudding Teaspoon --  Oral - Pudding Cup --  Oral - Honey Teaspoon --  Oral - Honey Cup --  Oral - Nectar Teaspoon --  Oral - Nectar Cup --  Oral - Nectar Straw --  Oral - Thin Teaspoon --  Oral - Thin Cup --  Oral - Thin Straw --  Oral - Puree --  Oral - Mech Soft --  Oral - Regular --  Oral - Multi-Consistency --  Oral - Pill --  Oral Phase - Comment --    CHL IP PHARYNGEAL PHASE 06/09/2021  Pharyngeal Phase WFL  Pharyngeal- Pudding Teaspoon --  Pharyngeal --  Pharyngeal- Pudding Cup --  Pharyngeal --  Pharyngeal- Honey Teaspoon --  Pharyngeal --  Pharyngeal- Honey Cup --  Pharyngeal --  Pharyngeal- Nectar Teaspoon --  Pharyngeal --  Pharyngeal- Nectar Cup [facility]  Pharyngeal Material does not enter airway  Pharyngeal- Nectar Straw --  Pharyngeal --  Pharyngeal- Thin Teaspoon --  Pharyngeal --  Pharyngeal- Thin Cup WFL;Reduced tongue base retraction;Pharyngeal residue - valleculae  Pharyngeal Material does not enter airway  Pharyngeal- Thin Straw --  Pharyngeal --  Pharyngeal- Puree WFL;Reduced tongue base retraction;Pharyngeal residue -  valleculae  Pharyngeal Material does not enter airway  Pharyngeal- Mechanical Soft Reduced tongue base retraction;Pharyngeal residue - valleculae  Pharyngeal Material does not enter airway  Pharyngeal- Regular --  Pharyngeal --  Pharyngeal- Multi-consistency --  Pharyngeal --  Pharyngeal- Pill Delayed swallow initiation-pyriform sinuses  Pharyngeal Material does not enter airway  Pharyngeal Comment --     CHL IP CERVICAL ESOPHAGEAL PHASE 06/09/2021  Cervical Esophageal Phase Impaired  Pudding Teaspoon --  Pudding Cup --  Honey Teaspoon --  Honey Cup --  Nectar Teaspoon --  Nectar Cup Esophageal backflow into cervical esophagus;Esophageal backflow into the pharynx  Nectar Straw --  Thin Teaspoon --  Thin Cup Esophageal backflow into cervical esophagus;Esophageal backflow into the pharynx  Thin Straw --  Puree Esophageal backflow into cervical esophagus;Esophageal backflow into the pharynx  Mechanical Soft Esophageal backflow into cervical esophagus;Esophageal backflow into the pharynx  Regular --  Multi-consistency --  Pill --  Cervical Esophageal Comment --   Deneise Lever, MS, CCC-SLP Speech-Language Pathologist   Aliene Altes 06/09/2021, 1:59 PM  Patient will benefit from skilled therapeutic intervention in order to improve the following deficits and impairments:   Dysphagia, unspecified type - Plan: DG SWALLOW FUNC OP MEDICARE SPEECH PATH, DG SWALLOW FUNC OP MEDICARE SPEECH PATH  Hoarseness - Plan: DG SWALLOW FUNC OP MEDICARE SPEECH PATH, DG SWALLOW FUNC OP MEDICARE SPEECH PATH  Feeding difficulties - Plan: DG SWALLOW FUNC OP MEDICARE SPEECH PATH, DG SWALLOW FUNC OP MEDICARE SPEECH PATH        Problem List Patient Active Problem List   Diagnosis Date Noted   Grieving 08/20/2020   Compression fracture of body of thoracic vertebra ([place]) 05/03/2020   Compression fracture of L2  lumbar vertebra ([place]) 05/03/2020   Acute left-sided thoracic back pain 01/20/2019   Essential hypertension 07/11/2018   Dry age-related macular degeneration    Hypothyroidism    Osteoporosis     Aliene Altes 06/09/2021, 1:59 PM  [place], [place], [address] Phone: [phone]   Fax:     Name: Megan Woods MRN: [id number] Date of Birth: March 23, 1935

## 2021-06-11 ENCOUNTER — Encounter: Payer: Self-pay | Admitting: *Deleted

## 2021-06-23 ENCOUNTER — Other Ambulatory Visit: Payer: Self-pay | Admitting: Family Medicine

## 2021-06-23 NOTE — Telephone Encounter (Signed)
Future OV 08/21/21. Most recent TSH 08/20/20 WNL Approved per protocol.  Requested Prescriptions  Pending Prescriptions Disp Refills  . hydrochlorothiazide (HYDRODIURIL) 12.5 MG tablet [Pharmacy Med Name: hydroCHLOROthiazide 12.5 MG Oral Tablet] 90 tablet 0    Sig: Take 1 tablet by mouth once daily     Cardiovascular: Diuretics - Thiazide Passed - 06/23/2021  9:48 AM      Passed - Ca in normal range and within 360 days    Calcium  Date Value Ref Range Status  08/20/2020 9.3 8.7 - 10.3 mg/dL Final  01/16/2019 9.3  Final         Passed - Cr in normal range and within 360 days    Creat  Date Value Ref Range Status  01/09/2019 0.62  Final   Creatinine, Ser  Date Value Ref Range Status  08/20/2020 0.91 0.57 - 1.00 mg/dL Final         Passed - K in normal range and within 360 days    Potassium  Date Value Ref Range Status  08/20/2020 3.8 3.5 - 5.2 mmol/L Final  01/16/2019 4.0  Final         Passed - Na in normal range and within 360 days    Sodium  Date Value Ref Range Status  08/20/2020 140 134 - 144 mmol/L Final         Passed - Last BP in normal range    BP Readings from Last 1 Encounters:  01/21/21 135/85         Passed - Valid encounter within last 6 months    Recent Outpatient Visits          5 months ago Essential hypertension   [facility] [place], Dionne Bucy, MD   10 months ago Essential hypertension   [place], Dionne Bucy, MD   1 year ago Essential hypertension   [place], Dionne Bucy, MD   1 year ago Injury of head, initial encounter   [facility] [place], [place]   1 year ago Essential hypertension   [place], Dionne Bucy, MD             . SYNTHROID 100 MCG tablet [Pharmacy Med Name: Synthroid 100 MCG Oral Tablet] 90 tablet 0    Sig: TAKE 1 TABLET BY MOUTH ONCE DAILY BEFORE BREAKFAST     Endocrinology:  Hypothyroid Agents Failed -  06/23/2021  9:48 AM      Failed - TSH needs to be rechecked within 3 months after an abnormal result. Refill until TSH is due.      Passed - TSH in normal range and within 360 days    TSH  Date Value Ref Range Status  08/20/2020 1.060 0.450 - 4.500 uIU/mL Final         Passed - Valid encounter within last 12 months    Recent Outpatient Visits          5 months ago Essential hypertension   [place], Dionne Bucy, MD   10 months ago Essential hypertension   [facility] [place], Dionne Bucy, MD   1 year ago Essential hypertension   [place], Dionne Bucy, MD   1 year ago Injury of head, initial encounter   [facility] [place], [place]   1 year ago Essential hypertension   [facility] [place], Dionne Bucy, MD

## 2021-07-16 DIAGNOSIS — H353222 Exudative age-related macular degeneration, left eye, with inactive choroidal neovascularization: Secondary | ICD-10-CM | POA: Diagnosis not present

## 2021-07-16 DIAGNOSIS — H353112 Nonexudative age-related macular degeneration, right eye, intermediate dry stage: Secondary | ICD-10-CM | POA: Diagnosis not present

## 2021-07-22 DIAGNOSIS — D225 Melanocytic nevi of trunk: Secondary | ICD-10-CM | POA: Diagnosis not present

## 2021-07-22 DIAGNOSIS — L821 Other seborrheic keratosis: Secondary | ICD-10-CM | POA: Diagnosis not present

## 2021-07-22 DIAGNOSIS — L814 Other melanin hyperpigmentation: Secondary | ICD-10-CM | POA: Diagnosis not present

## 2021-08-20 NOTE — Progress Notes (Signed)
Annual Wellness Visit     Patient: Megan Woods, Female    DOB: 1935-09-02, 86 y.o.   MRN: [id number] Visit Date: 08/21/2021  Today's Provider: Lavon Paganini, MD   Chief Complaint  Patient presents with   Annual Exam    Subjective    Megan Woods is a 86 y.o. female who presents today for her Annual Wellness Visit. She reports consuming a general diet. Home exercise routine includes walking. She generally feels fairly well. She reports sleeping fairly well. She does not have additional problems to discuss today.   HPI  Some low back pain after sleeping on a different mattress. No point tenderness  On estrogen patch for many years per GYN. Knows risks and doesn't want to taper.  Medications: Outpatient Medications Prior to Visit  Medication Sig   Calcium Carbonate (CALCIUM 600 PO) Take 600 mg by mouth daily.    Cholecalciferol (VITAMIN D PO) Take 1,000 mg by mouth daily.   estradiol (VIVELLE-DOT) 0.0375 MG/24HR Place 1 patch onto the skin 2 (two) times a week.    [DISCONTINUED] hydrochlorothiazide (HYDRODIURIL) 12.5 MG tablet Take 1 tablet by mouth once daily   [DISCONTINUED] SYNTHROID 100 MCG tablet TAKE 1 TABLET BY MOUTH ONCE DAILY BEFORE BREAKFAST   No facility-administered medications prior to visit.    Allergies  Allergen Reactions   Chocolate     Other reaction(s): other/intolerance   Elemental Sulfur     Other reaction(s): mild rash/itching   Iodine     Other reaction(s): mild rash/itching   Penicillins Swelling    Other reaction(s): mild rash/itching   Shellfish Allergy     Other reaction(s): other/intolerance "I pass out"    Patient Care Team: Virginia Crews, MD as PCP - General (Family Medicine) Romine, Lubertha South, MD (Obstetrics and Gynecology)  Review of Systems  Constitutional: Negative.   HENT: Negative.    Eyes: Negative.   Respiratory: Negative.    Cardiovascular: Negative.   Gastrointestinal: Negative.   Endocrine: Negative.    Genitourinary: Negative.   Musculoskeletal: Negative.   Skin: Negative.   Allergic/Immunologic: Negative.   Neurological: Negative.   Hematological: Negative.   Psychiatric/Behavioral: Negative.         Objective    Vitals: BP 134/74 (BP Location: Right Arm, Patient Position: Sitting, Cuff Size: Normal)    Pulse 63    Temp 98.3 F (36.8 C) (Temporal)    Ht 5\' 4"  (1.626 m)    Wt 119 lb (54 kg)    SpO2 100%    BMI 20.43 kg/m    Physical Exam Vitals reviewed.  Constitutional:      General: She is not in acute distress.    Appearance: Normal appearance. She is well-developed. She is not diaphoretic.  HENT:     Head: Normocephalic and atraumatic.     Right Ear: Tympanic membrane, ear canal and external ear normal.     Left Ear: Tympanic membrane, ear canal and external ear normal.  Eyes:     General: No scleral icterus.    Conjunctiva/sclera: Conjunctivae normal.     Pupils: Pupils are equal, round, and reactive to light.  Neck:     Thyroid: No thyromegaly.  Cardiovascular:     Rate and Rhythm: Normal rate and regular rhythm.     Pulses: Normal pulses.     Heart sounds: Normal heart sounds. No murmur heard. Pulmonary:     Effort: Pulmonary effort is normal. No respiratory distress.  Breath sounds: Normal breath sounds. No wheezing or rales.  Abdominal:     General: There is no distension.     Palpations: Abdomen is soft.     Tenderness: There is no abdominal tenderness.  Musculoskeletal:        General: No deformity.     Cervical back: Neck supple.     Right lower leg: No edema.     Left lower leg: No edema.  Lymphadenopathy:     Cervical: No cervical adenopathy.  Skin:    General: Skin is warm and dry.     Findings: No rash.  Neurological:     Mental Status: She is alert and oriented to person, place, and time. Mental status is at baseline.     Sensory: No sensory deficit.     Motor: No weakness.     Gait: Gait normal.  Psychiatric:        Mood and Affect:  Mood normal.        Behavior: Behavior normal.        Thought Content: Thought content normal.     Most recent functional status assessment: In your present state of health, do you have any difficulty performing the following activities: 08/21/2021  Hearing? Y  Vision? N  Difficulty concentrating or making decisions? N  Walking or climbing stairs? N  Dressing or bathing? N  Doing errands, shopping? N  Some recent data might be hidden   Most recent fall risk assessment: Fall Risk  08/21/2021  Falls in the past year? 0  Number falls in past yr: 0  Injury with Fall? 0  Risk for fall due to : Impaired balance/gait  Follow up -    Most recent depression screenings: PHQ 2/9 Scores 08/21/2021 01/21/2021  PHQ - 2 Score 0 0  PHQ- 9 Score 0 0   Most recent cognitive screening: 6CIT Screen 08/21/2021  What Year? 0 points  What month? 0 points  What time? 0 points  Count back from 20 0 points  Months in reverse 0 points  Repeat phrase 4 points  Total Score 4   Most recent Audit-C alcohol use screening Alcohol Use Disorder Test (AUDIT) 08/21/2021  1. How often do you have a drink containing alcohol? 2  2. How many drinks containing alcohol do you have on a typical day when you are drinking? 0  3. How often do you have six or more drinks on one occasion? 0  AUDIT-C Score 2  Alcohol Brief Interventions/Follow-up -   A score of 3 or more in women, and 4 or more in men indicates increased risk for alcohol abuse, EXCEPT if all of the points are from question 1   No results found for any visits on 08/21/21.  Assessment & Plan     Annual wellness visit done today including the all of the following: Reviewed patient's Family Medical History Reviewed and updated list of patient's medical providers Assessment of cognitive impairment was done Assessed patient's functional ability Established a written schedule for health screening [place] Completed and  Reviewed  Exercise Activities and Dietary recommendations  Goals   None      There is no immunization history on file for this patient.  Health Maintenance  Topic Date Due   TETANUS/TDAP  08/13/2023 (Originally 06/04/1954)   DEXA SCAN  05/02/2022   HPV VACCINES  Aged Out   Pneumonia Vaccine 67+ Years old  Discontinued   INFLUENZA VACCINE  Discontinued   COVID-19  Vaccine  Discontinued   Zoster Vaccines- Shingrix  Discontinued     Discussed health benefits of physical activity, and encouraged her to engage in regular exercise appropriate for her age and condition.    Problem List Items Addressed This Visit       Cardiovascular and Mediastinum   Essential hypertension    Well controlled Continue current medications Recheck metabolic panel F/u in 6 months       Relevant Medications   hydrochlorothiazide (HYDRODIURIL) 12.5 MG tablet   Other Relevant Orders   Lipid panel   Comprehensive metabolic panel     Endocrine   Hypothyroidism    Previously well controlled Continue Synthroid at current dose  Recheck TSH and adjust Synthroid as indicated        Relevant Medications   levothyroxine (SYNTHROID) 100 MCG tablet   Other Relevant Orders   TSH     Musculoskeletal and Integument   Osteoporosis    Recheck Vit D level Reviewed last DEXA Continue supplement and exercise      Relevant Orders   VITAMIN D 25 Hydroxy (Vit-D Deficiency, Fractures)   Other Visit Diagnoses     Encounter for annual wellness visit (AWV) in Medicare patient    -  Primary        Return in about 6 months (around 02/19/2022) for chronic disease f/u.     I, Lavon Paganini, MD, have reviewed all documentation for this visit. The documentation on 08/21/21 for the exam, diagnosis, procedures, and orders are all accurate and complete.   October Peery, Dionne Bucy, MD, MPH [facility]

## 2021-08-21 ENCOUNTER — Encounter: Payer: Self-pay | Admitting: Family Medicine

## 2021-08-21 ENCOUNTER — Other Ambulatory Visit: Payer: Self-pay

## 2021-08-21 ENCOUNTER — Ambulatory Visit (INDEPENDENT_AMBULATORY_CARE_PROVIDER_SITE_OTHER): Payer: Medicare Other | Admitting: Family Medicine

## 2021-08-21 VITALS — BP 134/74 | HR 63 | Temp 98.3°F | Ht 64.0 in | Wt 119.0 lb

## 2021-08-21 DIAGNOSIS — M810 Age-related osteoporosis without current pathological fracture: Secondary | ICD-10-CM | POA: Diagnosis not present

## 2021-08-21 DIAGNOSIS — Z0000 Encounter for general adult medical examination without abnormal findings: Secondary | ICD-10-CM

## 2021-08-21 DIAGNOSIS — E039 Hypothyroidism, unspecified: Secondary | ICD-10-CM

## 2021-08-21 DIAGNOSIS — I10 Essential (primary) hypertension: Secondary | ICD-10-CM

## 2021-08-21 MED ORDER — HYDROCHLOROTHIAZIDE 12.5 MG PO TABS: 12.5000 mg | ORAL_TABLET | Freq: Every day | ORAL | 1 refills | Status: DC

## 2021-08-21 MED ORDER — LEVOTHYROXINE SODIUM 100 MCG PO TABS: 100.0000 ug | ORAL_TABLET | Freq: Every day | ORAL | 1 refills | Status: DC

## 2021-08-21 NOTE — Assessment & Plan Note (Signed)
Previously well controlled Continue Synthroid at current dose  Recheck TSH and adjust Synthroid as indicated   

## 2021-08-21 NOTE — Assessment & Plan Note (Signed)
Well controlled Continue current medications Recheck metabolic panel F/u in 6 months  

## 2021-08-21 NOTE — Assessment & Plan Note (Signed)
Recheck Vit D level Reviewed last DEXA Continue supplement and exercise

## 2021-08-26 ENCOUNTER — Encounter: Payer: Self-pay | Admitting: Gastroenterology

## 2021-08-26 ENCOUNTER — Ambulatory Visit (INDEPENDENT_AMBULATORY_CARE_PROVIDER_SITE_OTHER): Payer: Medicare Other | Admitting: Gastroenterology

## 2021-08-26 VITALS — BP 148/87 | HR 63 | Temp 98.7°F | Ht 64.0 in | Wt 121.4 lb

## 2021-08-26 DIAGNOSIS — R1319 Other dysphagia: Secondary | ICD-10-CM | POA: Diagnosis not present

## 2021-08-26 NOTE — Patient Instructions (Signed)
Barium swallow study.

## 2021-08-26 NOTE — Addendum Note (Signed)
Addended by: Wayna Chalet on: 08/26/2021 02:44 PM   Modules accepted: Orders

## 2021-08-26 NOTE — Progress Notes (Signed)
Gastroenterology Consultation  Referring Provider:     Beverly Gust, MD Primary Care Physician:  Virginia Crews, MD Primary Gastroenterologist:  Dr. Allen Norris     Reason for Consultation:     Dysphagia        HPI:   Megan Woods is a 86 y.o. y/o female referred for consultation & management of dysphagia by Dr. Brita Romp, Dionne Bucy, MD. This patient comes in today after being seen by ENT with a modified barium swallow by speech pathology that showed:   there is retrograde bolus flow in the cervical esophagus with all consistencies, which intermittently backflows through the pharyngoesophageal segment and is retained in the post-cricoid region   The patient denies any nausea vomiting black stools bloody stools or unexplained weight loss.  The patient also denies any change in bowel habits.  She reports that her symptoms are made worse with very cold liquids where she feels like her throat is closing up but if she avoids that it does not usually happen.  She states she has been in her usual state of health without any problems.   Past Medical History:  Diagnosis Date   Allergy    Arthritis    Dry age-related macular degeneration    Hypertension    Hypothyroidism    Mitral valve disorder    Osteoporosis     Past Surgical History:  Procedure Laterality Date   ABDOMINAL HYSTERECTOMY  1975   also took out L ovary and cervix   APPENDECTOMY     CATARACT EXTRACTION     OOPHORECTOMY     R ovary removed many years after hysterectomy for benign cyst   SMALL BOWEL REPAIR     perforated during oophorectomy   TONSILLECTOMY      Prior to Admission medications   Medication Sig Start Date End Date Taking? Authorizing Provider  Calcium Carbonate (CALCIUM 600 PO) Take 600 mg by mouth daily.     [provider]  Cholecalciferol (VITAMIN D PO) Take 1,000 mg by mouth daily.    [provider]  estradiol (VIVELLE-DOT) 0.0375 MG/24HR Place 1 patch onto the skin 2 (two)  times a week.     [provider]  hydrochlorothiazide (HYDRODIURIL) 12.5 MG tablet Take 1 tablet (12.5 mg total) by mouth daily. 08/21/21   Virginia Crews, MD  levothyroxine (SYNTHROID) 100 MCG tablet Take 1 tablet (100 mcg total) by mouth daily before breakfast. 08/21/21   Virginia Crews, MD    Family History  Problem Relation Age of Onset   Breast cancer Sister 85     Social History   Tobacco Use   Smoking status: Never   Smokeless tobacco: Never  Substance Use Topics   Alcohol use: Never   Drug use: Never    Allergies as of 08/26/2021 - Review Complete 08/21/2021  Allergen Reaction Noted   Chocolate  07/08/2018   Elemental sulfur  12/30/2011   Iodine  12/30/2011   Penicillins Swelling 07/08/2018   Shellfish allergy  12/30/2011    Review of Systems:    All systems reviewed and negative except where noted in HPI.   Physical Exam:  There were no vitals taken for this visit. No LMP recorded. Patient is postmenopausal. General:   Alert,  Well-developed, well-nourished, pleasant and cooperative in NAD Head:  Normocephalic and atraumatic. Eyes:  Sclera clear, no icterus.   Conjunctiva pink. Ears:  Normal auditory acuity. Neck:  Supple; no masses or thyromegaly. Lungs:  Respirations even  and unlabored.  Clear throughout to auscultation.   No wheezes, crackles, or rhonchi. No acute distress. Heart:  Regular rate and rhythm; no murmurs, clicks, rubs, or gallops. Abdomen:  Normal bowel sounds.  No bruits.  Soft, non-tender and non-distended without masses, hepatosplenomegaly or hernias noted.  No guarding or rebound tenderness.  Negative Carnett sign.   Rectal:  Deferred.  Pulses:  Normal pulses noted. Extremities:  No clubbing or edema.  No cyanosis. Neurologic:  Alert and oriented x3;  grossly normal neurologically. Skin:  Intact without significant lesions or rashes.  No jaundice. Lymph Nodes:  No significant cervical adenopathy. Psych:  Alert and  cooperative. Normal mood and affect.  Imaging Studies: No results found.  Assessment and Plan:   Megan Woods is a 86 y.o. y/o female who comes in today with a history of dysphagia which she describes as the feeling that her throat is closing up when she drinks very cold drinks.  The patient has been seen by speech pathology and was referred to me for a possible motility disorder versus a cause for the patient to have reflux of the esophageal contents during the swallow evaluation.  The patient has been told that a motility disorder versus a narrowing in the esophagus may cause similar symptoms.  I like to get a dedicated barium swallow with a 13 mm pill set up for the patient to better evaluate the esophagus.  The patient has been explained the plan agrees with it.   Lucilla Lame, MD. Marval Regal    Note: This dictation was prepared with Dragon dictation along with smaller phrase technology. Any transcriptional errors that result from this process are unintentional.

## 2021-09-18 ENCOUNTER — Other Ambulatory Visit: Payer: Self-pay | Admitting: Family Medicine

## 2021-09-18 DIAGNOSIS — R058 Other specified cough: Secondary | ICD-10-CM | POA: Diagnosis not present

## 2021-09-18 DIAGNOSIS — Z20822 Contact with and (suspected) exposure to covid-19: Secondary | ICD-10-CM | POA: Diagnosis not present

## 2021-09-18 MED ORDER — LEVOTHYROXINE SODIUM 100 MCG PO TABS: 100.0000 ug | ORAL_TABLET | Freq: Every day | ORAL | 1 refills | Status: DC

## 2021-09-18 NOTE — Telephone Encounter (Signed)
Requested medication (s) are due for refill today - yes  Requested medication (s) are on the active medication list -yes  Future visit scheduled -yes  Last refill: 08/21/21 #30  Notes to clinic: Request RF: fails lab protocol- 08/20/20 last check  Requested Prescriptions  Pending Prescriptions Disp Refills   levothyroxine (SYNTHROID) 100 MCG tablet 30 tablet 1    Sig: Take 1 tablet (100 mcg total) by mouth daily before breakfast.     Endocrinology:  Hypothyroid Agents Failed - 09/18/2021 10:26 AM      Failed - TSH needs to be rechecked within 3 months after an abnormal result. Refill until TSH is due.      Failed - TSH in normal range and within 360 days    TSH  Date Value Ref Range Status  08/20/2020 1.060 0.450 - 4.500 uIU/mL Final          Passed - Valid encounter within last 12 months    Recent Outpatient Visits           4 weeks ago Encounter for annual wellness visit (AWV) in Medicare patient   [facility] [place], Dionne Bucy, MD   8 months ago Essential hypertension   [place], Dionne Bucy, MD   1 year ago Essential hypertension   [place], Dionne Bucy, MD   1 year ago Essential hypertension   [place], Dionne Bucy, MD   1 year ago Injury of head, initial encounter   [facility] [place], [place]       Future Appointments             In 5 months Bacigalupo, Dionne Bucy, MD [facility], [facility]               Requested Prescriptions  Pending Prescriptions Disp Refills   levothyroxine (SYNTHROID) 100 MCG tablet 30 tablet 1    Sig: Take 1 tablet (100 mcg total) by mouth daily before breakfast.     Endocrinology:  Hypothyroid Agents Failed - 09/18/2021 10:26 AM      Failed - TSH needs to be rechecked within 3 months after an abnormal result. Refill until TSH is due.      Failed - TSH in normal range and within 360 days    TSH   Date Value Ref Range Status  08/20/2020 1.060 0.450 - 4.500 uIU/mL Final          Passed - Valid encounter within last 12 months    Recent Outpatient Visits           4 weeks ago Encounter for annual wellness visit (AWV) in Medicare patient   [employer] [facility], Dionne Bucy, MD   8 months ago Essential hypertension   [facility] [place], Dionne Bucy, MD   1 year ago Essential hypertension   [facility] [place], Dionne Bucy, MD   1 year ago Essential hypertension   [place], Dionne Bucy, MD   1 year ago Injury of head, initial encounter   [facility] [place], [place]       Future Appointments             In 5 months Bacigalupo, Dionne Bucy, MD [facility], [facility], [facility], [place]

## 2021-09-18 NOTE — Telephone Encounter (Signed)
Copied from [place] [id number]. Topic: Quick Communication - Rx Refill/Question >> Sep 18, 2021 10:20 AM Yvette Rack wrote: Medication: levothyroxine (SYNTHROID) 100 MCG tablet  Has the patient contacted their pharmacy? Yes.  Pt told to contact provider for approval (Agent: If no, request that the patient contact the pharmacy for the refill. If patient does not wish to contact the pharmacy document the reason why and proceed with request.) (Agent: If yes, when and what did the pharmacy advise?)  Preferred Pharmacy (with phone number or street name): [place], [place]  Phone: [phone] Fax: [phone]  Has the patient been seen for an appointment in the last year OR does the patient have an upcoming appointment? Yes.    Agent: Please be advised that RX refills may take up to 3 business days. We ask that you follow-up with your pharmacy.

## 2021-11-07 IMAGING — CT CT HEAD W/O CM
4 series · 14 of 47 positions shown, 16 images · non-contrast
Comparison: None.

CLINICAL DATA: Trauma

EXAM:
CT HEAD WITHOUT CONTRAST
TECHNIQUE: Contiguous axial images were obtained from the base of the skull
through the vertex without intravenous contrast.

[Series 2: axial st head 5.00 ax · axial · 0.33mm/px · z∈[-601,-501]mm · 5 of 32 slices shown, 7 images]
[im 6/32  brain]
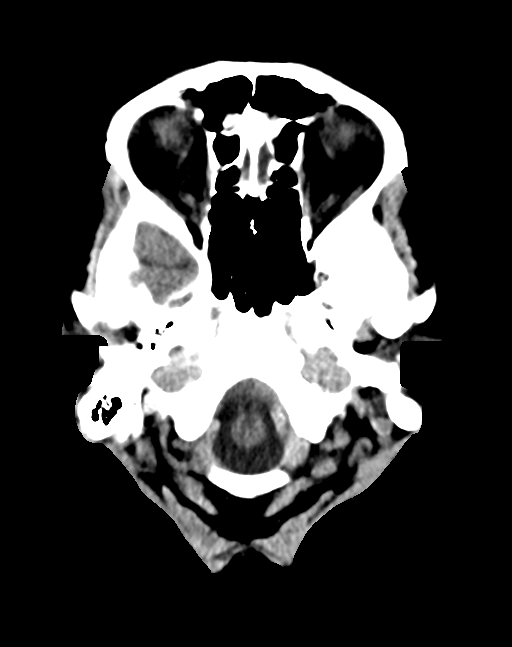
[im 6/32  bone]
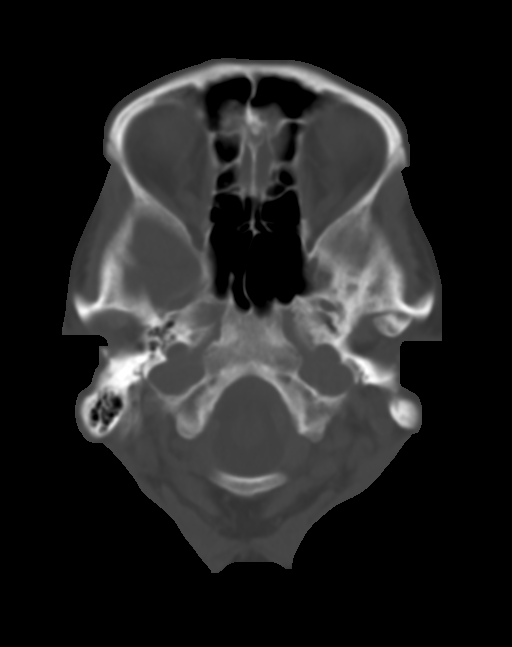
[im 11/32  brain]
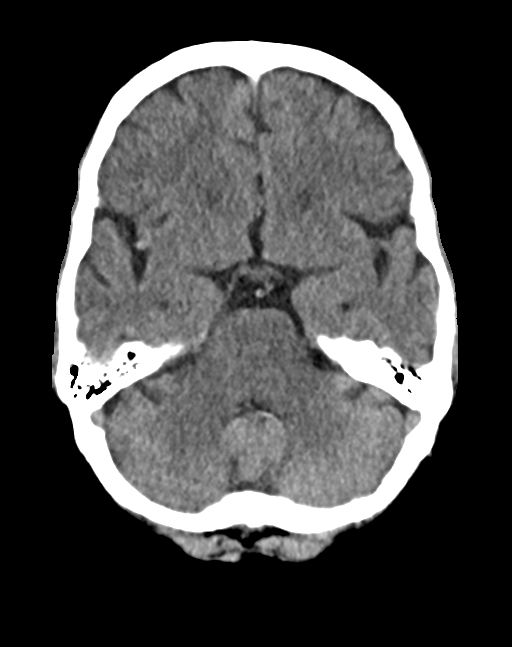
[im 16/32  brain]
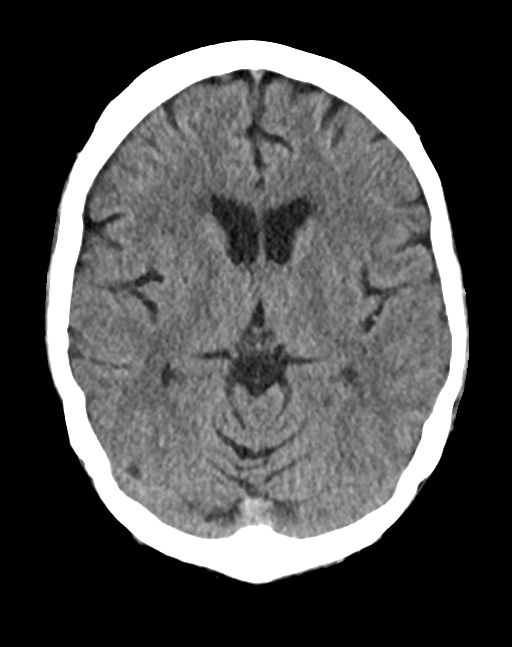
[im 21/32  brain]
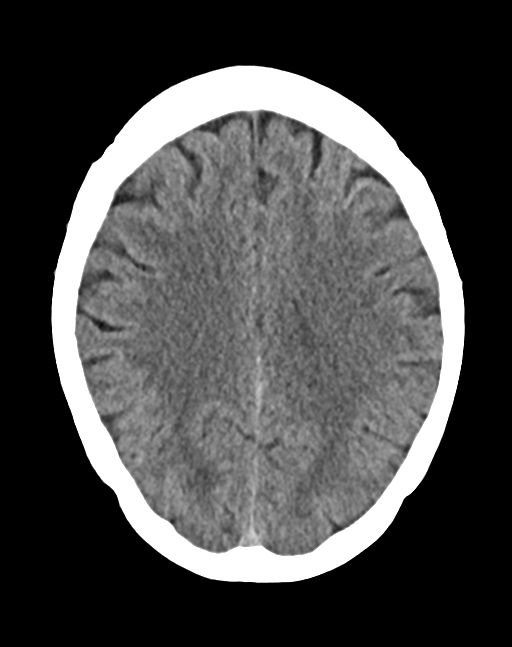
[im 26/32  brain]
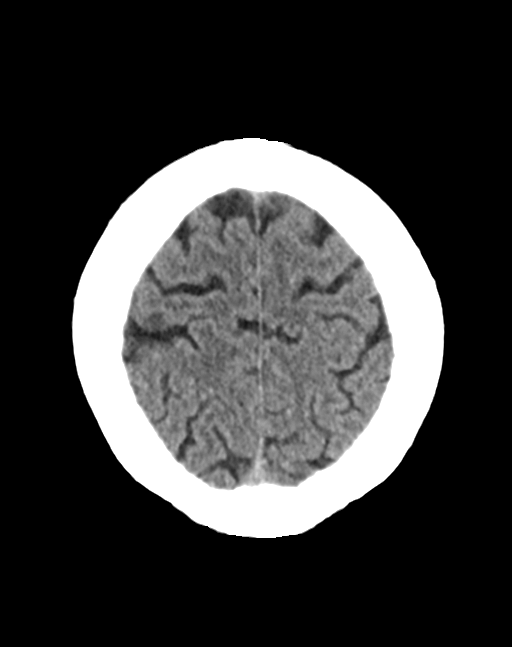
[im 26/32  bone]
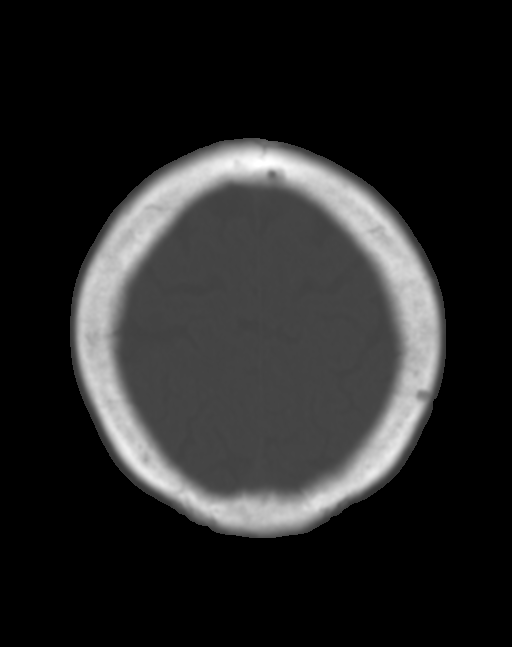

[Series 4: coronals head 3.00 cor · coronal · 0.32mm/px · 3 of 62 slices shown]
[im 21/62  brain]
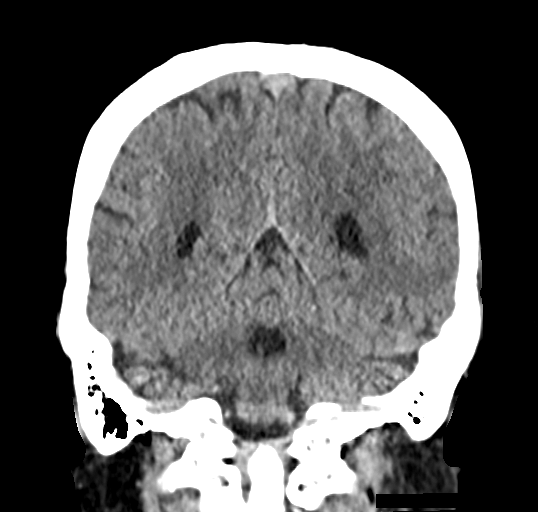
[im 28/62  brain]
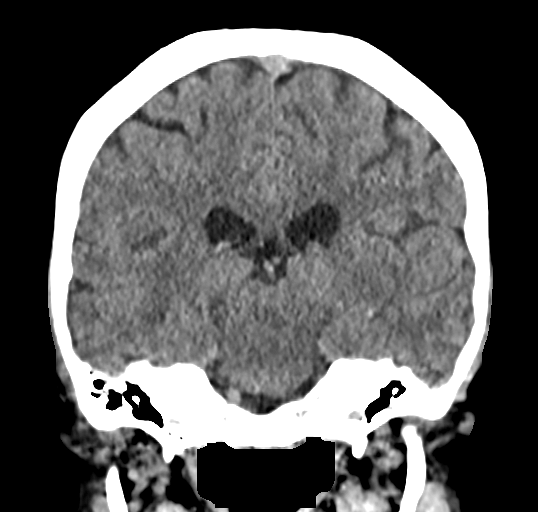
[im 34/62  brain]
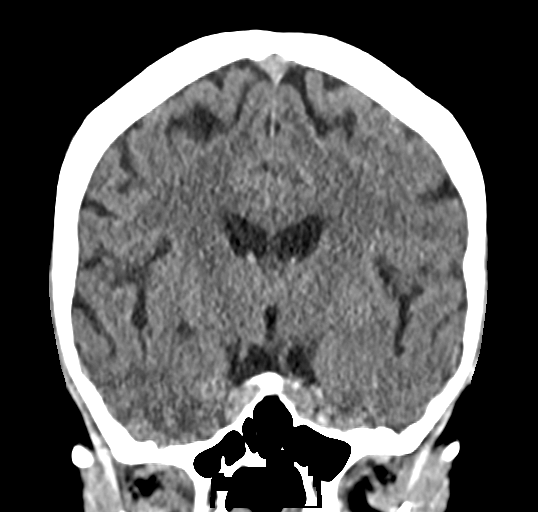

[Series 6: sagittals head 3.00 sag · sagittal · 0.32mm/px · 3 of 49 slices shown]
[im 17/49  brain]
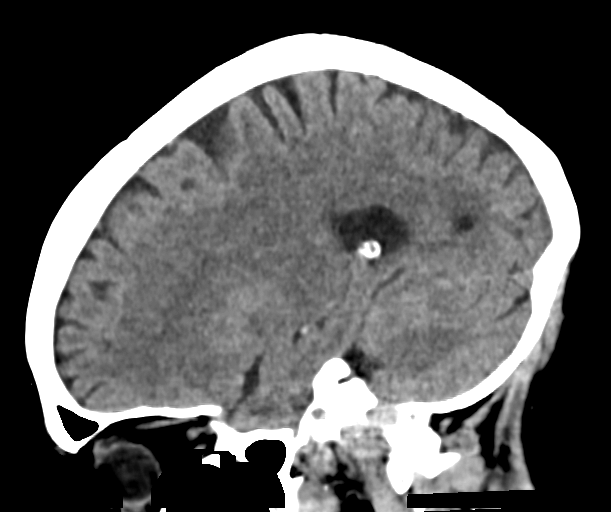
[im 25/49  brain]
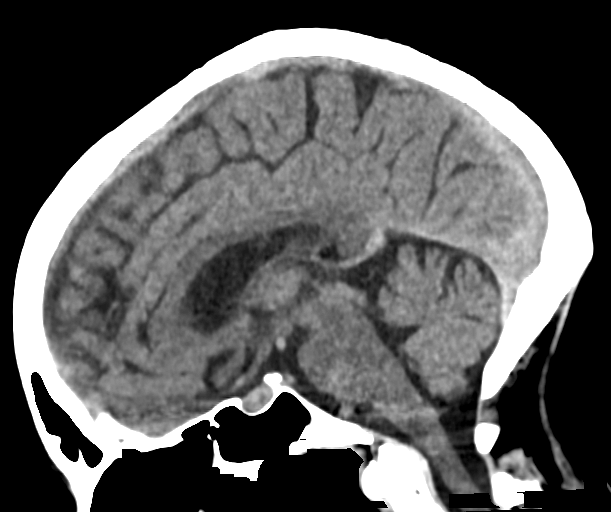
[im 33/49  brain]
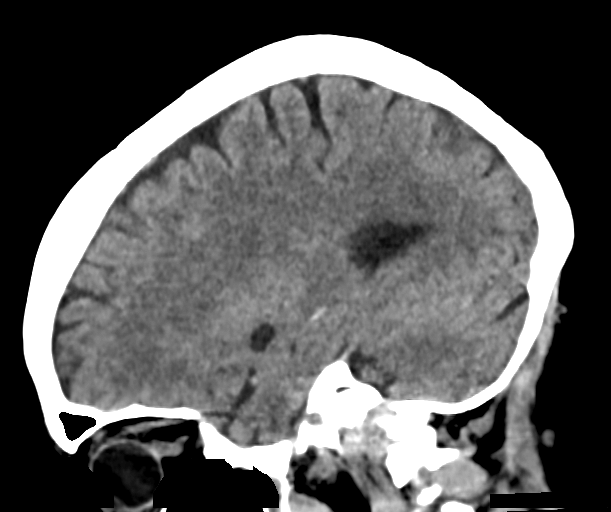

[Series 8: axial bone head 1.50 ax · axial · 0.33mm/px · z∈[-609,-572]mm · 3 of 108 slices shown]
[im 10/108  bone]
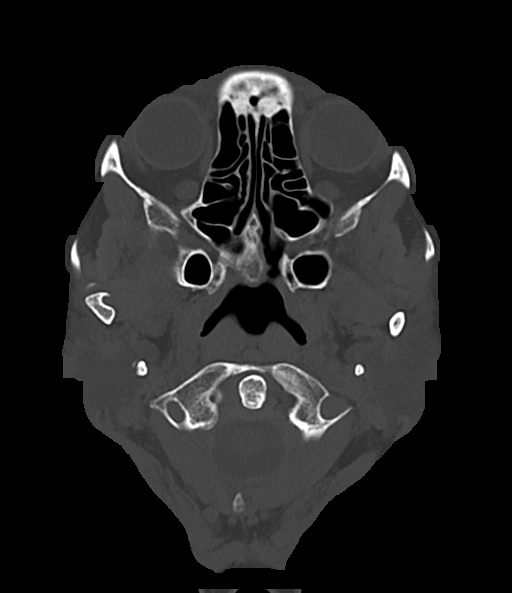
[im 20/108  bone]
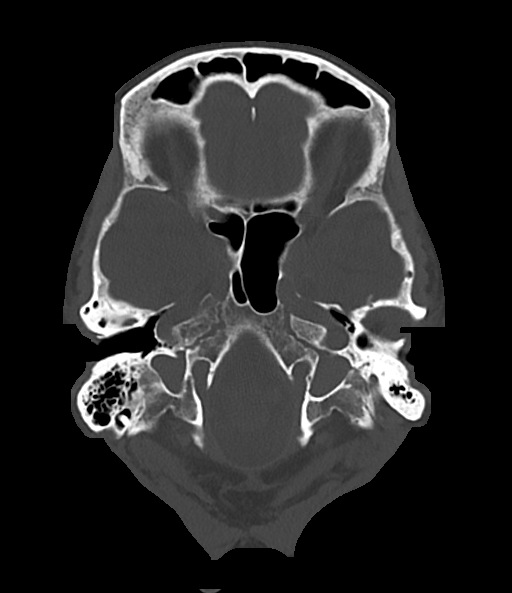
[im 35/108  bone]
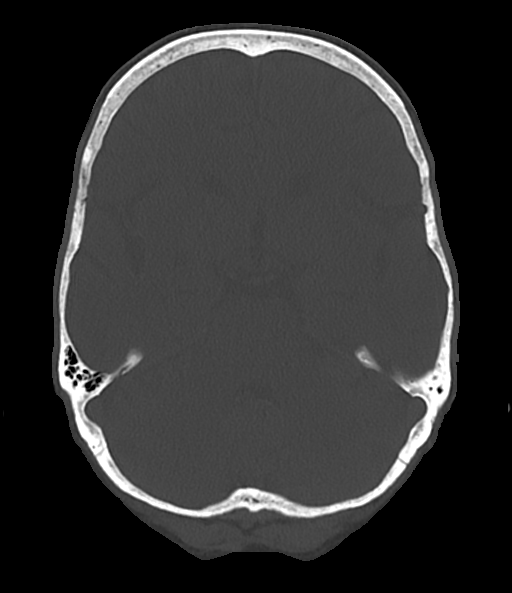

[14 of 47 positions shown; findings below may reference images not displayed]

FINDINGS: Brain: No acute infarct or intracranial hemorrhage. Sequela of
remote right basal ganglia insult versus dilated perivascular space.
No mass lesion. Bilateral basal ganglia calcifications. No midline
shift, ventriculomegaly or extra-axial fluid collection.

Vascular: No hyperdense vessel or unexpected calcification.

Skull: Negative for fracture or focal lesion.

Sinuses/Orbits: Normal orbits. Clear paranasal sinuses. No mastoid
effusion.

Other: None.
IMPRESSION: No acute intracranial process.

Remote right basal ganglia insult versus dilated perivascular space.

## 2022-01-27 DIAGNOSIS — H6592 Unspecified nonsuppurative otitis media, left ear: Secondary | ICD-10-CM | POA: Diagnosis not present

## 2022-02-04 ENCOUNTER — Other Ambulatory Visit: Payer: Self-pay | Admitting: Family Medicine

## 2022-02-04 NOTE — Telephone Encounter (Signed)
Medication Refill - Medication: levothyroxine (SYNTHROID) 100 MCG tablet   Has the patient contacted their pharmacy? Yes.   (Agent: If no, request that the patient contact the pharmacy for the refill. If patient does not wish to contact the pharmacy document the reason why and proceed with request.) (Agent: If yes, when and what did the pharmacy advise?)  Preferred Pharmacy (with phone number or street name):  [place], [place]  [place] [address]  Phone: [phone] Fax: [phone]   Has the patient been seen for an appointment in the last year OR does the patient have an upcoming appointment? Yes.    Agent: Please be advised that RX refills may take up to 3 business days. We ask that you follow-up with your pharmacy.

## 2022-02-05 ENCOUNTER — Ambulatory Visit (INDEPENDENT_AMBULATORY_CARE_PROVIDER_SITE_OTHER): Payer: Medicare Other | Admitting: Family Medicine

## 2022-02-05 ENCOUNTER — Encounter: Payer: Self-pay | Admitting: Family Medicine

## 2022-02-05 VITALS — BP 150/88 | HR 64 | Temp 97.8°F | Resp 16 | Wt 117.0 lb

## 2022-02-05 DIAGNOSIS — E039 Hypothyroidism, unspecified: Secondary | ICD-10-CM | POA: Diagnosis not present

## 2022-02-05 DIAGNOSIS — I10 Essential (primary) hypertension: Secondary | ICD-10-CM

## 2022-02-05 DIAGNOSIS — E782 Mixed hyperlipidemia: Secondary | ICD-10-CM

## 2022-02-05 MED ORDER — LEVOTHYROXINE SODIUM 100 MCG PO TABS: 100.0000 ug | ORAL_TABLET | Freq: Every day | ORAL | 1 refills | Status: DC

## 2022-02-05 NOTE — Telephone Encounter (Signed)
Requested medication (s) are due for refill today:   Yes  Requested medication (s) are on the active medication list:   Yes  Future visit scheduled:   Yes this morning at 8:40 with Dr. Brita Romp (6/1)   Last ordered: 09/18/2021 #30, 1 refill  Returned because TSH due per protocol.   Pt has appt this morning.   Requested Prescriptions  Pending Prescriptions Disp Refills   levothyroxine (SYNTHROID) 100 MCG tablet 30 tablet 1    Sig: Take 1 tablet (100 mcg total) by mouth daily before breakfast.     Endocrinology:  Hypothyroid Agents Failed - 02/04/2022 10:40 AM      Failed - TSH in normal range and within 360 days    TSH  Date Value Ref Range Status  08/20/2020 1.060 0.450 - 4.500 uIU/mL Final         Passed - Valid encounter within last 12 months    Recent Outpatient Visits           5 months ago Encounter for annual wellness visit (AWV) in Medicare patient   [employer], Dionne Bucy, MD   1 year ago Essential hypertension   [facility] [place], Dionne Bucy, MD   1 year ago Essential hypertension   [facility] [place], Dionne Bucy, MD   1 year ago Essential hypertension   [place], Dionne Bucy, MD   1 year ago Injury of head, initial encounter   [place], Wendee [place], [place]       Future Appointments             Today Bacigalupo, Dionne Bucy, MD [facility], [place]

## 2022-02-05 NOTE — Assessment & Plan Note (Signed)
Reviewed last lipid panel Not currently on a statin Recheck FLP and CMP Discussed diet and exercise  

## 2022-02-05 NOTE — Assessment & Plan Note (Signed)
Previously well controlled Continue Synthroid at current dose  Recheck TSH and adjust Synthroid as indicated   

## 2022-02-05 NOTE — Assessment & Plan Note (Signed)
Well controlled - reasonable higher goal given age Continue current medications Recheck metabolic panel F/u in 6 months

## 2022-02-05 NOTE — Progress Notes (Signed)
I,Sulibeya S Dimas,acting as a Education administrator for Lavon Paganini, MD.,have documented all relevant documentation on the behalf of Lavon Paganini, MD,as directed by  Lavon Paganini, MD while in the presence of Lavon Paganini, MD.   Established patient visit   Patient: Megan Woods   DOB: [birth year]   87 y.o. Female  MRN: [id number] Visit Date: 02/05/2022  Today's healthcare provider: Lavon Paganini, MD   Chief Complaint  Patient presents with   Hypertension   Hypothyroidism   Subjective    HPI  Hypertension, follow-up  BP Readings from Last 3 Encounters:  02/05/22 (!) 150/88  08/26/21 (!) 148/87  08/21/21 134/74   Wt Readings from Last 3 Encounters:  02/05/22 117 lb (53.1 kg)  08/26/21 121 lb 6.4 oz (55.1 kg)  08/21/21 119 lb (54 kg)     She was last seen for hypertension 6 months ago.  BP at that visit was 148/87 . Management since that visit includes no changes.  She reports excellent compliance with treatment. She is not having side effects.  She is following a Low Sodium diet. She is exercising. She does not smoke.  Use of agents associated with hypertension: none.   Outside blood pressures are stable. Symptoms: No chest pain No chest pressure  No palpitations No syncope  No dyspnea No orthopnea  No paroxysmal nocturnal dyspnea No lower extremity edema   Pertinent labs Lab Results  Component Value Date   CHOL 204 (H) 08/20/2020   HDL 80 08/20/2020   LDLCALC 108 (H) 08/20/2020   TRIG 90 08/20/2020   CHOLHDL 2.6 08/20/2020   Lab Results  Component Value Date   NA 140 08/20/2020   K 3.8 08/20/2020   CREATININE 0.91 08/20/2020   GFRNONAA 59 (L) 08/20/2020   GLUCOSE 90 08/20/2020   TSH 1.060 08/20/2020     The ASCVD Risk score (Arnett DK, et al., 2019) failed to calculate for the following reasons:   The 2019 ASCVD risk score is only valid for ages 68 to  44  --------------------------------------------------------------------------------------------------- Hypothyroid, follow-up  Lab Results  Component Value Date   TSH 1.060 08/20/2020   TSH 0.500 02/07/2020   TSH 0.747 08/29/2019    Wt Readings from Last 3 Encounters:  02/05/22 117 lb (53.1 kg)  08/26/21 121 lb 6.4 oz (55.1 kg)  08/21/21 119 lb (54 kg)    She was last seen for hypothyroid 6 months ago.  Management since that visit includes no changes. She reports excellent compliance with treatment. She is not having side effects.   Symptoms: No change in energy level No constipation  No diarrhea No heat / cold intolerance  No nervousness No palpitations  No weight changes    -----------------------------------------------------------------------------------------   Medications: Outpatient Medications Prior to Visit  Medication Sig   Calcium Carbonate (CALCIUM 600 PO) Take 600 mg by mouth daily.    Cholecalciferol (VITAMIN D PO) Take 1,000 mg by mouth daily.   estradiol (VIVELLE-DOT) 0.0375 MG/24HR Place 1 patch onto the skin 2 (two) times a week.    hydrochlorothiazide (HYDRODIURIL) 12.5 MG tablet Take 1 tablet (12.5 mg total) by mouth daily.   [DISCONTINUED] levothyroxine (SYNTHROID) 100 MCG tablet Take 1 tablet (100 mcg total) by mouth daily before breakfast.   No facility-administered medications prior to visit.    Review of Systems  Constitutional:  Negative for appetite change, fatigue and unexpected weight change.  Respiratory:  Negative for chest tightness and shortness of breath.   Cardiovascular:  Negative for  chest pain, palpitations and leg swelling.  Gastrointestinal:  Negative for constipation, diarrhea and nausea.  Neurological:  Negative for headaches.  Psychiatric/Behavioral:  Negative for sleep disturbance. The patient is not nervous/anxious.        Objective    BP (!) 150/88 (BP Location: Left Arm, Cuff Size: Normal)   Pulse 64   Temp 97.8  F (36.6 C) (Oral)   Resp 16   Wt 117 lb (53.1 kg)   BMI 20.08 kg/m  BP Readings from Last 3 Encounters:  02/05/22 (!) 150/88  08/26/21 (!) 148/87  08/21/21 134/74   Wt Readings from Last 3 Encounters:  02/05/22 117 lb (53.1 kg)  08/26/21 121 lb 6.4 oz (55.1 kg)  08/21/21 119 lb (54 kg)      Physical Exam Vitals reviewed.  Constitutional:      General: She is not in acute distress.    Appearance: Normal appearance. She is well-developed. She is not diaphoretic.  HENT:     Head: Normocephalic and atraumatic.  Eyes:     General: No scleral icterus.    Conjunctiva/sclera: Conjunctivae normal.  Neck:     Thyroid: No thyromegaly.  Cardiovascular:     Rate and Rhythm: Normal rate and regular rhythm.     Pulses: Normal pulses.     Heart sounds: Normal heart sounds. No murmur heard. Pulmonary:     Effort: Pulmonary effort is normal. No respiratory distress.     Breath sounds: Normal breath sounds. No wheezing, rhonchi or rales.  Musculoskeletal:     Cervical back: Neck supple.     Right lower leg: No edema.     Left lower leg: No edema.  Lymphadenopathy:     Cervical: No cervical adenopathy.  Skin:    General: Skin is warm and dry.     Findings: No rash.  Neurological:     Mental Status: She is alert and oriented to person, place, and time. Mental status is at baseline.  Psychiatric:        Mood and Affect: Mood normal.        Behavior: Behavior normal.      No results found for any visits on 02/05/22.  Assessment & Plan     Problem List Items Addressed This Visit       Cardiovascular and Mediastinum   Essential hypertension - Primary    Well controlled - reasonable higher goal given age Continue current medications Recheck metabolic panel F/u in 6 months       Relevant Orders   Comprehensive metabolic panel     Endocrine   Hypothyroidism    Previously well controlled Continue Synthroid at current dose  Recheck TSH and adjust Synthroid as indicated         Relevant Medications   levothyroxine (SYNTHROID) 100 MCG tablet   Other Relevant Orders   TSH     Other   Moderate mixed hyperlipidemia not requiring statin therapy    Reviewed last lipid panel Not currently on a statin Recheck FLP and CMP Discussed diet and exercise       Relevant Orders   Lipid panel   Comprehensive metabolic panel     Return in about 6 months (around 08/07/2022) for CPE, AWV.      I, Lavon Paganini, MD, have reviewed all documentation for this visit. The documentation on 02/05/22 for the exam, diagnosis, procedures, and orders are all accurate and complete.   Teliyah Royal, Dionne Bucy, MD, MPH [facility]

## 2022-02-06 LAB — LIPID PANEL
Chol/HDL Ratio: 2.2 ratio (ref 0.0–4.4)
Cholesterol, Total: 186 mg/dL (ref 100–199)
HDL: 83 mg/dL (ref >39)
LDL Chol Calc (NIH): 93 mg/dL (ref 0–99)
Triglycerides: 50 mg/dL (ref 0–149)
VLDL Cholesterol Cal: 10 mg/dL (ref 5–40)

## 2022-02-06 LAB — COMPREHENSIVE METABOLIC PANEL
ALT: 13 IU/L (ref 0–32)
AST: 16 IU/L (ref 0–40)
Albumin/Globulin Ratio: 2.7 — ABNORMAL HIGH (ref 1.2–2.2)
Albumin: 4.3 g/dL (ref 3.6–4.6)
Alkaline Phosphatase: 64 IU/L (ref 44–121)
BUN/Creatinine Ratio: 11 — ABNORMAL LOW (ref 12–28)
BUN: 10 mg/dL (ref 8–27)
Bilirubin Total: 0.5 mg/dL (ref 0.0–1.2)
CO2: 26 mmol/L (ref 20–29)
Calcium: 10.1 mg/dL (ref 8.7–10.3)
Chloride: 103 mmol/L (ref 96–106)
Creatinine, Ser: 0.87 mg/dL (ref 0.57–1.00)
Globulin, Total: 1.6 g/dL (ref 1.5–4.5)
Glucose: 82 mg/dL (ref 70–99)
Potassium: 4.5 mmol/L (ref 3.5–5.2)
Sodium: 141 mmol/L (ref 134–144)
Total Protein: 5.9 g/dL — ABNORMAL LOW (ref 6.0–8.5)
eGFR: 66 mL/min/{1.73_m2} (ref >59)

## 2022-02-06 LAB — TSH: TSH: 1.64 u[IU]/mL (ref 0.450–4.500)

## 2022-02-16 ENCOUNTER — Ambulatory Visit: Payer: Medicare Other | Admitting: Family Medicine

## 2022-02-16 DIAGNOSIS — L820 Inflamed seborrheic keratosis: Secondary | ICD-10-CM | POA: Diagnosis not present

## 2022-02-16 DIAGNOSIS — L821 Other seborrheic keratosis: Secondary | ICD-10-CM | POA: Diagnosis not present

## 2022-02-16 DIAGNOSIS — D492 Neoplasm of unspecified behavior of bone, soft tissue, and skin: Secondary | ICD-10-CM | POA: Diagnosis not present

## 2022-02-17 ENCOUNTER — Ambulatory Visit: Payer: Self-pay | Admitting: *Deleted

## 2022-02-17 DIAGNOSIS — D229 Melanocytic nevi, unspecified: Secondary | ICD-10-CM

## 2022-02-17 NOTE — Telephone Encounter (Signed)
Summary: suspicious legion on back   Pt daughter stated a suspicious legion her back appeared in the last few weeks in a weird shape and color, with strange raised growth on one end. Causing discomfort and itching.   Pt daughter is requesting a possible referral as well.      Chief Complaint: had mole removed yesterday suspicious for cancer Symptoms: multi color, rough edges Frequency: na Pertinent Negatives: Patient denies na Disposition: '[]'$ ED /'[]'$ Urgent Care (no appt availability in office) / '[]'$ Appointment(In office/virtual)/ '[]'$  [facility]/ '[]'$ Home Care/ '[]'$ Refused Recommended Disposition /'[]'$ [facility]/ '[x]'$  Follow-up with PCP Additional Notes: Pt is in [place], she had mole biopsied yesterday in [place]. She needs her PCP to refer her to [facility] [phone], the referral has to be from PCP. If you want to speak with dermatologist in [place] it is [facility], saw Mickel Baas, [phone].   Reason for Disposition  [1] Skin growth or mole AND [2] sticks up out of the skin (elevated) AND [3] feels rough to the touch  Answer Assessment - Initial Assessment Questions 1. APPEARANCE of LESION: "What does it look like?"      Crusty, odd shape, thinks it is cancer 2. SIZE: "How big is it?" (e.g., inches, cm; or compare to size of pinhead, tip of pen, eraser, coin, pea, grape, ping pong ball)      Size of fingernail 3. COLOR: "What color is it?" "Is there more than one color?"     Pink and different colors 4. SHAPE: "What shape is it?" (e.g., round, irregular)     Growths sticking out 5. RAISED: "Does it stick up above the skin or is it flat?" (e.g., raised or elevated)     Sticks up 6. TENDER: "Does it hurt when you touch it?"  (Scale 1-10; or mild, moderate, severe)     na 7. LOCATION: "Where is it located?"      Left shoulder blade 8. ONSET: "When did it first appear?"      Recently  9. NUMBER: "Is there just one?" or "Are there others?"      one 10. CAUSE: "What do you think it is?"       cancer 11. OTHER SYMPTOMS: "Do you have any other symptoms?" (e.g., fever)       na 12. PREGNANCY: "Is there any chance you are pregnant?" "When was your last menstrual period?"       na  Protocols used: Skin Lesion - Moles or Growths-A-AH

## 2022-02-19 NOTE — Addendum Note (Signed)
Addended by: Shawna Orleans on: 02/19/2022 08:33 AM   Modules accepted: Orders

## 2022-02-19 NOTE — Telephone Encounter (Signed)
Ok to send referral as requested - use dx atypical nevus

## 2022-04-03 ENCOUNTER — Other Ambulatory Visit: Payer: Self-pay

## 2022-04-03 ENCOUNTER — Telehealth: Payer: Self-pay | Admitting: Family Medicine

## 2022-04-03 MED ORDER — HYDROCHLOROTHIAZIDE 12.5 MG PO TABS: 12.5000 mg | ORAL_TABLET | Freq: Every day | ORAL | 0 refills | Status: DC

## 2022-04-03 NOTE — Telephone Encounter (Signed)
[facility] faxed refill request for the following medications:  hydrochlorothiazide (HYDRODIURIL) 12.5 MG tablet   Please advise.

## 2022-04-03 NOTE — Telephone Encounter (Signed)
Refill sent . Pt informed.  KP

## 2022-04-27 ENCOUNTER — Telehealth: Payer: Self-pay | Admitting: Family Medicine

## 2022-04-27 MED ORDER — DOXYCYCLINE HYCLATE 100 MG PO TABS: 100.0000 mg | ORAL_TABLET | Freq: Once | ORAL | 0 refills | Status: AC

## 2022-04-27 NOTE — Telephone Encounter (Signed)
Per current guidelines, should not need prophylaxis if she still has her native valve even if it prolapses. However given her procedure is in the morning, to avoid delay of treatment, will give prophylaxis. Encourage talking to dentist regarding need for prophylaxis in the future as she likely does not need going forward.

## 2022-04-27 NOTE — Telephone Encounter (Signed)
LMTCB-Ok for [facility] Nurse to advise 

## 2022-04-27 NOTE — Telephone Encounter (Signed)
Pt. States she has a prolapsed mitral valve and "always takes an antibiotic for dental procedures." Procedure is in a.m. Please advise pt.

## 2022-04-27 NOTE — Telephone Encounter (Signed)
Patient called stating she is having two crowns put on at the dentist tomorrow. She needs an antibiotics called in today because she has to have this before she goes to dentist. Her pharmacy is  [facility] [address] [place] - [place]  [place] [address]  Phone: [phone] Fax: [phone]

## 2022-04-28 NOTE — Telephone Encounter (Signed)
Spoke with patient and let her know to talk to her dentist about prophylaxis before procedures. She said her procedure today was rescheduled for September after she took the antibiotic and said she would let us know if she needs it again closer to that appt.

## 2022-05-10 ENCOUNTER — Other Ambulatory Visit: Payer: Self-pay | Admitting: Family Medicine

## 2022-05-12 ENCOUNTER — Other Ambulatory Visit: Payer: Self-pay | Admitting: Family Medicine

## 2022-05-12 NOTE — Telephone Encounter (Unsigned)
Copied from [place] [id number]. Topic: General - Other >> May 12, 2022  8:53 AM Everette C wrote: Reason for CRM: Medication Refill - Medication: hydrochlorothiazide (HYDRODIURIL) 12.5 MG tablet [[id number]]   Has the patient contacted their pharmacy? No. The patient has experienced a loss in their family and [place]. The patient will be flying to [place] in the morning and is requesting to pick up their medication today  (Agent: If no, request that the patient contact the pharmacy for the refill. If patient does not wish to contact the pharmacy document the reason why and proceed with request.) (Agent: If yes, when and what did the pharmacy advise?)  Preferred Pharmacy (with phone number or street name): [place], [place] - [place] [address] Phone: [phone] Fax: [phone] Hours: Not open 24 hours   Has the patient been seen for an appointment in the last year OR does the patient have an upcoming appointment? Yes.    Agent: Please be advised that RX refills may take up to 3 business days. We ask that you follow-up with your pharmacy.

## 2022-05-13 NOTE — Telephone Encounter (Signed)
Medication refilled 05/12/2022 #90 0 refills by another provider.  Requested Prescriptions  Pending Prescriptions Disp Refills  . hydrochlorothiazide (HYDRODIURIL) 12.5 MG tablet 90 tablet 0    Sig: Take 1 tablet (12.5 mg total) by mouth daily.     Cardiovascular: Diuretics - Thiazide Failed - 05/12/2022 10:33 AM      Failed - Last BP in normal range    BP Readings from Last 1 Encounters:  02/05/22 (!) 150/88         Passed - Cr in normal range and within 180 days    Creat  Date Value Ref Range Status  01/09/2019 0.62  Final   Creatinine, Ser  Date Value Ref Range Status  02/05/2022 0.87 0.57 - 1.00 mg/dL Final         Passed - K in normal range and within 180 days    Potassium  Date Value Ref Range Status  02/05/2022 4.5 3.5 - 5.2 mmol/L Final  01/16/2019 4.0  Final         Passed - Na in normal range and within 180 days    Sodium  Date Value Ref Range Status  02/05/2022 141 134 - 144 mmol/L Final         Passed - Valid encounter within last 6 months    Recent Outpatient Visits          3 months ago Essential hypertension   [place], Dionne Bucy, MD   8 months ago Encounter for annual wellness visit (AWV) in Medicare patient   [employer] [facility], Dionne Bucy, MD   1 year ago Essential hypertension   [place], Dionne Bucy, MD   1 year ago Essential hypertension   [place], Dionne Bucy, MD   2 years ago Essential hypertension   [facility] [place], Dionne Bucy, MD

## 2022-05-19 DIAGNOSIS — Z1231 Encounter for screening mammogram for malignant neoplasm of breast: Secondary | ICD-10-CM | POA: Diagnosis not present

## 2022-05-19 LAB — HM MAMMOGRAPHY

## 2022-05-28 DIAGNOSIS — H353232 Exudative age-related macular degeneration, bilateral, with inactive choroidal neovascularization: Secondary | ICD-10-CM | POA: Diagnosis not present

## 2022-06-04 DIAGNOSIS — H60333 Swimmer's ear, bilateral: Secondary | ICD-10-CM | POA: Diagnosis not present

## 2022-06-21 ENCOUNTER — Other Ambulatory Visit: Payer: Self-pay

## 2022-06-21 ENCOUNTER — Emergency Department
Admission: EM | Admit: 2022-06-21 | Discharge: 2022-06-21 | Disposition: A | Discharge status: Home / Self Care | Payer: Medicare Other | Attending: Emergency Medicine | Admitting: Emergency Medicine

## 2022-06-21 ENCOUNTER — Emergency Department: Payer: Medicare Other

## 2022-06-21 ENCOUNTER — Encounter: Payer: Self-pay | Admitting: Intensive Care

## 2022-06-21 DIAGNOSIS — R0789 Other chest pain: Secondary | ICD-10-CM | POA: Diagnosis not present

## 2022-06-21 DIAGNOSIS — E039 Hypothyroidism, unspecified: Secondary | ICD-10-CM | POA: Diagnosis not present

## 2022-06-21 DIAGNOSIS — M546 Pain in thoracic spine: Secondary | ICD-10-CM | POA: Insufficient documentation

## 2022-06-21 DIAGNOSIS — I10 Essential (primary) hypertension: Secondary | ICD-10-CM | POA: Insufficient documentation

## 2022-06-21 DIAGNOSIS — R079 Chest pain, unspecified: Secondary | ICD-10-CM | POA: Diagnosis present

## 2022-06-21 LAB — CBC
HCT: 43.7 % (ref 36.0–46.0)
Hemoglobin: 14.2 g/dL (ref 12.0–15.0)
MCH: 30.6 pg (ref 26.0–34.0)
MCHC: 32.5 g/dL (ref 30.0–36.0)
MCV: 94.2 fL (ref 80.0–100.0)
Platelets: 294 10*3/uL (ref 150–400)
RBC: 4.64 MIL/uL (ref 3.87–5.11)
RDW: 12.4 % (ref 11.5–15.5)
WBC: 6.6 10*3/uL (ref 4.0–10.5)
nRBC: 0 % (ref 0.0–0.2)

## 2022-06-21 LAB — BASIC METABOLIC PANEL
Anion gap: 5 (ref 5–15)
BUN: 13 mg/dL (ref 8–23)
CO2: 28 mmol/L (ref 22–32)
Calcium: 9.1 mg/dL (ref 8.9–10.3)
Chloride: 106 mmol/L (ref 98–111)
Creatinine, Ser: 0.91 mg/dL (ref 0.44–1.00)
GFR, Estimated: >60 mL/min (ref >60)
Glucose, Bld: 117 mg/dL — ABNORMAL HIGH (ref 70–99)
Potassium: 3.8 mmol/L (ref 3.5–5.1)
Sodium: 139 mmol/L (ref 135–145)

## 2022-06-21 LAB — TROPONIN I (HIGH SENSITIVITY)
Troponin I (High Sensitivity): 10 ng/L (ref <18)
Troponin I (High Sensitivity): 11 ng/L (ref <18)

## 2022-06-21 LAB — D-DIMER, QUANTITATIVE: D-Dimer, Quant: 0.29 ug/mL-FEU (ref 0.00–0.50)

## 2022-06-21 MED ORDER — LIDOCAINE 5 % EX PTCH: 1.0000 | MEDICATED_PATCH | Freq: Two times a day (BID) | CUTANEOUS | 0 refills | Status: DC

## 2022-06-21 MED ORDER — LIDOCAINE 5 % EX PTCH
1.0000 | MEDICATED_PATCH | CUTANEOUS | Status: DC
  Administered 2022-06-21: 1 via TRANSDERMAL
  Filled 2022-06-21: qty 1

## 2022-06-21 NOTE — ED Provider Triage Note (Signed)
  Emergency Medicine Provider Triage Evaluation Note  Megan Woods , a 87 y.o.female,  was evaluated in triage.  Pt complains of chest pain for the past few days.  She states that it hurts consistently with intermittent radiation to the left breast and right shoulder blade.  Describes as a knife sticking in her chest.   Review of Systems  Positive: Chest pain Negative: Denies fever, abdominal pain, vomiting  Physical Exam   Vitals:   06/21/22 1450  BP: (!) 167/91  Pulse: 68  Resp: 18  Temp: 97.8 F (36.6 C)  SpO2: 98%   Gen:   Awake, no distress   Resp:  Normal effort  MSK:   Moves extremities without difficulty  Other:    Medical Decision Making  Given the patient's initial medical screening exam, the following diagnostic evaluation has been ordered. The patient will be placed in the appropriate treatment space, once one is available, to complete the evaluation and treatment. I have discussed the plan of care with the patient and I have advised the patient that an ED physician or mid-level practitioner will reevaluate their condition after the test results have been received, as the results may give them additional insight into the type of treatment they may need.    Diagnostics: Labs, EKG, CXR  Treatments: none immediately   Teodoro Spray, [place] 06/21/22 1659

## 2022-06-21 NOTE — ED Triage Notes (Signed)
Patient c/o consistent chest pain with radiation to left breast and shoulder blade. Describes pain as "sticking a knife in chest"

## 2022-06-21 NOTE — ED Provider Notes (Signed)
[facility] Provider Note    Event Date/Time   First MD Initiated Contact with Patient 06/21/22 1812     (approximate)   History   Chief Complaint Chest Pain   HPI  Megan Woods is a 87 y.o. female with past medical history of hypertension, hyperlipidemia, and hypothyroidism who presents to the ED complaining of chest pain.  Patient reports that she has been dealing with constant pain in her left upper back wrapping around the left side of her chest for the past 4 days.  She describes pain as sharp, not exacerbated or alleviated by anything in particular.  She denies any associated fevers or cough, has not had any difficulty breathing.  She denies any recent trauma to her chest or back, has not been doing any heavy lifting.  She has not had any pain or swelling in her legs, denies any history of DVT/PE.  She has been taking Tylenol with minimal relief of her pain.     Physical Exam   Triage Vital Signs: ED Triage Vitals  Enc Vitals Group     BP 06/21/22 1450 (!) 167/91     Pulse Rate 06/21/22 1450 68     Resp 06/21/22 1450 18     Temp 06/21/22 1450 97.8 F (36.6 C)     Temp Source 06/21/22 1450 Oral     SpO2 06/21/22 1450 98 %     Weight 06/21/22 1446 118 lb (53.5 kg)     Height 06/21/22 1446 '5\' 4"'$  (1.626 m)     Head Circumference --      Peak Flow --      Pain Score 06/21/22 1446 7     Pain Loc --      Pain Edu? --      Excl. in [place]? --     Most recent vital signs: Vitals:   06/21/22 1835 06/21/22 1930  BP: (!) 163/89 (!) 153/83  Pulse: 62 (!) 59  Resp: 18 18  Temp:    SpO2: 99% 99%    Constitutional: Alert and oriented. Eyes: Conjunctivae are normal. Head: Atraumatic. Nose: No congestion/rhinnorhea. Mouth/Throat: Mucous membranes are moist.  Cardiovascular: Normal rate, regular rhythm. Grossly normal heart sounds.  2+ radial pulses bilaterally. Respiratory: Normal respiratory effort.  No retractions. Lungs CTAB.  No chest wall  tenderness to palpation. Gastrointestinal: Soft and nontender. No distention. Musculoskeletal: No lower extremity tenderness nor edema.  Neurologic:  Normal speech and language. No gross focal neurologic deficits are appreciated.    ED Results / Procedures / Treatments   Labs (all labs ordered are listed, but only abnormal results are displayed) Labs Reviewed  BASIC METABOLIC PANEL - Abnormal; Notable for the following components:      Result Value   Glucose, Bld 117 (*)    All other components within normal limits  CBC  D-DIMER, QUANTITATIVE  TROPONIN I (HIGH SENSITIVITY)  TROPONIN I (HIGH SENSITIVITY)     EKG  ED ECG REPORT I, Blake Divine, the attending physician, personally viewed and interpreted this ECG.   Date: 06/21/2022  EKG Time: 14:47  Rate: 71  Rhythm: normal sinus rhythm  Axis: LAd  Intervals:left bundle branch block  ST&T Change: None, negative sgarbossa  RADIOLOGY Chest x-ray reviewed and interpreted by me with no infiltrate, edema, or effusion.  PROCEDURES:  Critical Care performed: No  Procedures   MEDICATIONS ORDERED IN ED: Medications  lidocaine (LIDODERM) 5 % 1 patch (1 patch Transdermal Patch Applied 06/21/22 1830)  IMPRESSION / MDM / ASSESSMENT AND PLAN / ED COURSE  I reviewed the triage vital signs and the nursing notes.                              87 y.o. female with past medical history of hypertension, hyperlipidemia, and hypothyroidism who presents to the ED complaining of left upper back pain radiating around the left side of her chest constantly for the past 4 days.  Patient's presentation is most consistent with acute presentation with potential threat to life or bodily function.  Differential diagnosis includes, but is not limited to, ACS, PE, dissection, pneumonia, pneumothorax, muscle strain, shingles.  Patient nontoxic-appearing and in no acute distress, vital signs are unremarkable.  EKG shows left bundle branch  block similar to previous with no ischemic changes, 2 sets of troponin are negative.  I doubt ACS given atypical symptoms but we will need to rule out PE.  Given she is low risk, will check D-dimer.  Remainder of labs are reassuring with no significant anemia, leukocytosis, electrolyte abnormality, or AKI.  Chest x-ray is also unremarkable, we will treat symptomatically with Lidoderm patch as patient states she cannot tolerate NSAIDs, has already been taking Tylenol, and is not interested in narcotic pain medication.  D-dimer within normal limits and I doubt PE as patient is low risk by Wells criteria.  Patient reports pain improved following placement of Lidoderm patch and suspect source of symptoms is musculoskeletal.  Patient is appropriate for discharge home with PCP follow-up, will be prescribed Lidoderm patches and was counseled to continue taking Tylenol as needed.  She was counseled to return to the ED for new or worsening symptoms, patient and family agree with plan.      FINAL CLINICAL IMPRESSION(S) / ED DIAGNOSES   Final diagnoses:  Atypical chest pain  Acute left-sided thoracic back pain     Rx / DC Orders   ED Discharge Orders          Ordered    lidocaine (LIDODERM) 5 %  Every 12 hours        06/21/22 1956             Note:  This document was prepared using Dragon voice recognition software and may include unintentional dictation errors.   Blake Divine, MD 06/21/22 1958

## 2022-06-22 ENCOUNTER — Telehealth: Payer: Self-pay

## 2022-06-22 ENCOUNTER — Telehealth: Payer: Self-pay | Admitting: Emergency Medicine

## 2022-06-22 DIAGNOSIS — M810 Age-related osteoporosis without current pathological fracture: Secondary | ICD-10-CM

## 2022-06-22 MED ORDER — LIDOCAINE 5 % EX PTCH: 1.0000 | MEDICATED_PATCH | Freq: Two times a day (BID) | CUTANEOUS | 11 refills | Status: DC

## 2022-06-22 NOTE — Telephone Encounter (Signed)
Copied from [place] [id number]. Topic: General - Inquiry >> Jun 22, 2022  2:45 PM Rosanne Ashing P wrote: Reason for CRM: Pt called wanting to know if she needed a bone density test this year?  CB@  [phone]

## 2022-06-22 NOTE — Telephone Encounter (Signed)
Yes she is due for DEXA. We can order it if she likes. For osteoporosis

## 2022-06-22 NOTE — Telephone Encounter (Signed)
Resend lidoderm to pharmacy

## 2022-06-23 ENCOUNTER — Ambulatory Visit (INDEPENDENT_AMBULATORY_CARE_PROVIDER_SITE_OTHER): Payer: Medicare Other | Admitting: Family Medicine

## 2022-06-23 ENCOUNTER — Encounter: Payer: Self-pay | Admitting: Family Medicine

## 2022-06-23 VITALS — BP 129/84 | HR 78 | Temp 97.7°F | Resp 16 | Wt 120.8 lb

## 2022-06-23 DIAGNOSIS — M546 Pain in thoracic spine: Secondary | ICD-10-CM

## 2022-06-23 NOTE — Progress Notes (Signed)
I,Sulibeya S Dimas,acting as a Education administrator for Lavon Paganini, MD.,have documented all relevant documentation on the behalf of Lavon Paganini, MD,as directed by  Lavon Paganini, MD while in the presence of Lavon Paganini, MD.     Established patient visit   Patient: Megan Woods   DOB: [birth year]   87 y.o. Female  MRN: [id number] Visit Date: 06/23/2022  Today's healthcare provider: Lavon Paganini, MD   Chief Complaint  Patient presents with   Follow-up   Subjective    HPI  Follow up ER visit  Patient was seen in ER for chest pain on 06/21/22. She was treated for atypical chest pain and acute left-sided thoracic back pain. Treatment for this included lidocaine 5% every 12 hours. She reports excellent compliance with treatment. She reports this condition is Improved.   Negative D dimer, stable troponin, negative CXR. Thought to be MSK.  She has had some improvement with massge. No furhter chest pain.   -----------------------------------------------------------------------------------------   Medications: Outpatient Medications Prior to Visit  Medication Sig   Calcium Carbonate (CALCIUM 600 PO) Take 600 mg by mouth daily.    Cholecalciferol (VITAMIN D PO) Take 1,000 mg by mouth daily.   estradiol (VIVELLE-DOT) 0.0375 MG/24HR Place 1 patch onto the skin 2 (two) times a week.    hydrochlorothiazide (HYDRODIURIL) 12.5 MG tablet Take 1 tablet by mouth once daily   levothyroxine (SYNTHROID) 100 MCG tablet Take 1 tablet (100 mcg total) by mouth daily before breakfast.   lidocaine (LIDODERM) 5 % Place 1 patch onto the skin every 12 (twelve) hours. Remove & Discard patch within 12 hours or as directed by MD   lidocaine (LIDODERM) 5 % Place 1 patch onto the skin every 12 (twelve) hours. Remove & Discard patch within 12 hours or as directed by MD   No facility-administered medications prior to visit.    Review of Systems  Constitutional:  Negative for appetite change  and fatigue.  Respiratory:  Negative for chest tightness and shortness of breath.   Cardiovascular:  Negative for chest pain and palpitations.  Musculoskeletal:  Positive for back pain.       Objective    BP 129/84 (BP Location: Left Arm, Cuff Size: Normal)   Pulse 78   Temp 97.7 F (36.5 C) (Oral)   Resp 16   Wt 120 lb 12.8 oz (54.8 kg)   BMI 20.74 kg/m  BP Readings from Last 3 Encounters:  06/23/22 129/84  06/21/22 (!) 153/83  02/05/22 (!) 150/88   Wt Readings from Last 3 Encounters:  06/23/22 120 lb 12.8 oz (54.8 kg)  06/21/22 118 lb (53.5 kg)  02/05/22 117 lb (53.1 kg)      Physical Exam Vitals reviewed.  Constitutional:      General: She is not in acute distress.    Appearance: Normal appearance. She is well-developed. She is not diaphoretic.  HENT:     Head: Normocephalic and atraumatic.  Eyes:     General: No scleral icterus.    Conjunctiva/sclera: Conjunctivae normal.  Neck:     Thyroid: No thyromegaly.  Cardiovascular:     Rate and Rhythm: Normal rate and regular rhythm.     Heart sounds: Normal heart sounds.  Pulmonary:     Effort: Pulmonary effort is normal. No respiratory distress.     Breath sounds: Normal breath sounds. No wheezing, rhonchi or rales.  Musculoskeletal:     Cervical back: Neck supple.     Right lower leg: No edema.  Left lower leg: No edema.     Comments: No TTP on back in midline or laterally. Normal ROM  Lymphadenopathy:     Cervical: No cervical adenopathy.  Skin:    General: Skin is warm and dry.     Findings: No rash.  Neurological:     Mental Status: She is alert and oriented to person, place, and time. Mental status is at baseline.  Psychiatric:        Mood and Affect: Mood normal.        Behavior: Behavior normal.       No results found for any visits on 06/23/22.  Assessment & Plan     1. Acute bilateral thoracic back pain -Reviewed ER records including imaging and labs - Reviewed with patient that all  life-threatening concerning causes of her chest pain were ruled out - She no longer has any chest pain and her back pain seems to be musculoskeletal and is being helped by lidocaine patches and massage - She can continue these modalities and use heating pad as needed - Discussed return precautions  No follow-ups on file.     I, Lavon Paganini, MD, have reviewed all documentation for this visit. The documentation on 06/23/22 for the exam, diagnosis, procedures, and orders are all accurate and complete.   Quinnton Bury, Dionne Bucy, MD, MPH [facility]

## 2022-06-23 NOTE — Telephone Encounter (Signed)
Left detailed message advising bone density scan has been placed.

## 2022-06-23 NOTE — Telephone Encounter (Signed)
Pt returned Suli's call to let her know she needs the scan done today or tomorrow before she leaves for travel / please advise

## 2022-07-20 DIAGNOSIS — H353112 Nonexudative age-related macular degeneration, right eye, intermediate dry stage: Secondary | ICD-10-CM | POA: Diagnosis not present

## 2022-07-20 DIAGNOSIS — H353222 Exudative age-related macular degeneration, left eye, with inactive choroidal neovascularization: Secondary | ICD-10-CM | POA: Diagnosis not present

## 2022-07-22 DIAGNOSIS — L821 Other seborrheic keratosis: Secondary | ICD-10-CM | POA: Diagnosis not present

## 2022-07-22 DIAGNOSIS — Z85828 Personal history of other malignant neoplasm of skin: Secondary | ICD-10-CM | POA: Diagnosis not present

## 2022-07-22 DIAGNOSIS — D225 Melanocytic nevi of trunk: Secondary | ICD-10-CM | POA: Diagnosis not present

## 2022-07-22 DIAGNOSIS — Z08 Encounter for follow-up examination after completed treatment for malignant neoplasm: Secondary | ICD-10-CM | POA: Diagnosis not present

## 2022-07-22 DIAGNOSIS — L814 Other melanin hyperpigmentation: Secondary | ICD-10-CM | POA: Diagnosis not present

## 2022-08-07 ENCOUNTER — Other Ambulatory Visit: Payer: Self-pay | Admitting: Family Medicine

## 2022-08-19 ENCOUNTER — Telehealth: Payer: Self-pay | Admitting: Family Medicine

## 2022-08-19 NOTE — Telephone Encounter (Signed)
Left message for patient to call back and schedule Medicare Annual Wellness Visit (AWV) in office.   If not able to come in office, please offer to do virtually or by telephone.  Left office number and my jabber [phone].  Last AWV:08/21/2021   Please schedule at anytime with Nurse Health Advisor.

## 2022-08-26 ENCOUNTER — Telehealth: Payer: Self-pay

## 2022-08-26 NOTE — Telephone Encounter (Signed)
Copied from [place] [phone]. Topic: Appointment Scheduling - Scheduling Inquiry for Clinic >> Aug 26, 2022  1:24 PM Megan Woods wrote: Reason for CRM: Patient called in missed appt for AWV, dint hear the phone. Please call back

## 2022-08-26 NOTE — Telephone Encounter (Signed)
I spoke to patient and she rescheduled her appointment to 09/15/2022.

## 2022-09-15 ENCOUNTER — Ambulatory Visit (INDEPENDENT_AMBULATORY_CARE_PROVIDER_SITE_OTHER): Payer: Medicare Other

## 2022-09-15 VITALS — Wt 120.0 lb

## 2022-09-15 DIAGNOSIS — Z0000 Encounter for general adult medical examination without abnormal findings: Secondary | ICD-10-CM

## 2022-09-15 NOTE — Progress Notes (Signed)
Virtual Visit via Telephone Note  I connected with  Megan Woods on 09/15/22 at  8:15 AM EST by telephone and verified that I am speaking with the correct person using two identifiers.  Location: Patient: home Provider: [facility] Persons participating in the virtual visit: [place]   I discussed the limitations, risks, security and privacy concerns of performing an evaluation and management service by telephone and the availability of in person appointments. The patient expressed understanding and agreed to proceed.  Interactive audio and video telecommunications were attempted between this nurse and patient, however failed, due to patient having technical difficulties OR patient did not have access to video capability.  We continued and completed visit with audio only.  Some vital signs may be absent or patient reported.   Dionisio David, LPN  Subjective:   Megan Woods is a 88 y.o. female who presents for Medicare Annual (Subsequent) preventive examination.  Review of Systems     Cardiac Risk Factors include: advanced age (>3mn, >>15women);hypertension     Objective:    There were no vitals filed for this visit. There is no height or weight on file to calculate BMI.     09/15/2022    8:24 AM 06/21/2022    2:48 PM 01/15/2020   10:32 AM 01/09/2019    2:48 PM  Advanced Directives  Does Patient Have a Medical Advance Directive? No No Yes Yes  Type of AComptrollerLiving will [place] will  Copy of [place] Chart?   No - copy requested No - copy requested  Would patient like information on creating a medical advance directive? No - Patient declined No - Patient declined      Current Medications (verified) Outpatient Encounter Medications as of 09/15/2022  Medication Sig   Calcium Carbonate (CALCIUM 600 PO) Take 600 mg by mouth daily.    Cholecalciferol (VITAMIN D PO) Take  1,000 mg by mouth daily.   estradiol (VIVELLE-DOT) 0.0375 MG/24HR Place 1 patch onto the skin 2 (two) times a week.    hydrochlorothiazide (HYDRODIURIL) 12.5 MG tablet Take 1 tablet by mouth once daily   levothyroxine (SYNTHROID) 100 MCG tablet TAKE 1 TABLET BY MOUTH ONCE DAILY BEFORE BREAKFAST   lidocaine (LIDODERM) 5 % Place 1 patch onto the skin every 12 (twelve) hours. Remove & Discard patch within 12 hours or as directed by MD   [DISCONTINUED] lidocaine (LIDODERM) 5 % Place 1 patch onto the skin every 12 (twelve) hours. Remove & Discard patch within 12 hours or as directed by MD   No facility-administered encounter medications on file as of 09/15/2022.    Allergies (verified) Shellfish allergy, Sulfur, Elemental sulfur, Iodine, Penicillins, and Chocolate   History: Past Medical History:  Diagnosis Date   Allergy    Arthritis    Dry age-related macular degeneration    Hypertension    Hypothyroidism    Mitral valve disorder    Osteoporosis    Past Surgical History:  Procedure Laterality Date   ABDOMINAL HYSTERECTOMY  1975   also took out L ovary and cervix   APPENDECTOMY     CATARACT EXTRACTION     OOPHORECTOMY     R ovary removed many years after hysterectomy for benign cyst   SMALL BOWEL REPAIR     perforated during oophorectomy   TONSILLECTOMY     Family History  Problem Relation Age of Onset   Breast cancer Sister 537  Social History   Socioeconomic History   Marital status: Widowed    Spouse name: Not on file   Number of children: 3   Years of education: Not on file   Highest education level: Master's degree (e.g., MA, MS, MEng, MEd, MSW, MBA)  Occupational History   Occupation: retired  Tobacco Use   Smoking status: Never   Smokeless tobacco: Never  Vaping Use   Vaping Use: Never used  Substance and Sexual Activity   Alcohol use: Yes    Alcohol/week: 4.0 standard drinks of alcohol    Types: 4 Glasses of wine per week   Drug use: Never   Sexual  activity: Not on file  Other Topics Concern   Not on file  Social History Narrative   Not on file   Social Determinants of Health   Financial Resource Strain: Low Risk  (09/15/2022)   Overall Financial Resource Strain (CARDIA)    Difficulty of Paying Living Expenses: Not hard at all  Food Insecurity: No Food Insecurity (09/15/2022)   Hunger Vital Sign    Worried About Running Out of Food in the Last Year: Never true    Ran Out of Food in the Last Year: Never true  Transportation Needs: No Transportation Needs (09/15/2022)   PRAPARE - Hydrologist (Medical): No    Lack of Transportation (Non-Medical): No  Physical Activity: Sufficiently Active (09/15/2022)   Exercise Vital Sign    Days of Exercise per Week: 7 days    Minutes of Exercise per Session: 30 min  Stress: No Stress Concern Present (09/15/2022)   [name]    Feeling of Stress : Only a little  Social Connections: Moderately Isolated (09/15/2022)   Social Connection and Isolation Panel [NHANES]    Frequency of Communication with Friends and Family: More than three times a week    Frequency of Social Gatherings with Friends and Family: More than three times a week    Attends Religious Services: More than 4 times per year    Active Member of [employer] or Organizations: No    Attends Archivist Meetings: Never    Marital Status: Widowed    Tobacco Counseling Counseling given: Not Answered   Clinical Intake:  Pre-visit preparation completed: Yes  Pain : No/denies pain     Nutritional Risks: None Diabetes: No  How often do you need to have someone help you when you read instructions, pamphlets, or other written materials from your doctor or pharmacy?: 1 - Never  Diabetic?no  Interpreter Needed?: No  Information entered by :: Kirke Shaggy, LPN   Activities of Daily Living    09/15/2022    8:25 AM 02/05/2022    8:55 AM   In your present state of health, do you have any difficulty performing the following activities:  Hearing? 1 1  Vision? 0 0  Difficulty concentrating or making decisions? 0 0  Walking or climbing stairs? 0 0  Dressing or bathing? 0 0  Doing errands, shopping? 0 0  Preparing Food and eating ? N   Using the Toilet? N   In the past six months, have you accidently leaked urine? N   Do you have problems with loss of bowel control? N   Managing your Medications? N   Managing your Finances? N   Housekeeping or managing your Housekeeping? N     Patient Care Team: Virginia Crews, MD as PCP -  General (Family Medicine) Romine, Lubertha South, MD (Obstetrics and Gynecology)  Indicate any recent Medical Services you may have received from other than [facility] providers in the past year (date may be approximate).     Assessment:   This is a routine wellness examination for Megan Woods.  Hearing/Vision screen Hearing Screening - Comments:: Wears aids Vision Screening - Comments:: Readers- MD in [place] issues and exercise activities discussed: Current Exercise Habits: Home exercise routine, Type of exercise: walking;calisthenics, Time (Minutes): 30, Frequency (Times/Week): 7, Weekly Exercise (Minutes/Week): 210, Intensity: Mild   Goals Addressed             This Visit's Progress    DIET - EAT MORE FRUITS AND VEGETABLES         Depression Screen    09/15/2022    8:22 AM 02/05/2022    8:55 AM 08/21/2021    3:14 PM 01/21/2021   11:12 AM 01/15/2020   10:30 AM 01/09/2019    2:49 PM 07/08/2018    3:23 PM  PHQ 2/9 Scores  PHQ - 2 Score 0 0 0 0 0 0 0  PHQ- 9 Score 0 0 0 0   0    Fall Risk    09/15/2022    8:25 AM 02/05/2022    8:55 AM 08/21/2021    3:15 PM 01/21/2021   11:12 AM 01/15/2020   10:33 AM  Fall Risk   Falls in the past year? 1 1 0 1 0  Number falls in past yr: 0 0 0 1 0  Injury with Fall? 0 0 0 0 0  Risk for fall due to : History of fall(s) History of fall(s) Impaired  balance/gait History of fall(s)   Follow up Falls prevention discussed;Falls evaluation completed Falls evaluation completed;Education provided;Falls prevention discussed  Falls evaluation completed     FALL RISK PREVENTION PERTAINING TO THE HOME:  Any stairs in or around the home? Yes  If so, are there any without handrails? No  Home free of loose throw rugs in walkways, pet beds, electrical cords, etc? Yes  Adequate lighting in your home to reduce risk of falls? Yes   ASSISTIVE DEVICES UTILIZED TO PREVENT FALLS:  Life alert? No  Use of a cane, walker or w/c? No  Grab bars in the bathroom? Yes  Shower chair or bench in shower? Yes  Elevated toilet seat or a handicapped toilet? No   Cognitive Function:        09/15/2022    8:27 AM 08/21/2021    2:12 PM 01/09/2019    2:55 PM  6CIT Screen  What Year? 0 points 0 points 0 points  What month? 0 points 0 points 0 points  What time? 0 points 0 points 0 points  Count back from 20 0 points 0 points 0 points  Months in reverse 0 points 0 points 0 points  Repeat phrase 0 points 4 points 0 points  Total Score 0 points 4 points 0 points    Immunizations  There is no immunization history on file for this patient.  TDAP status: Due, Education has been provided regarding the importance of this vaccine. Advised may receive this vaccine at local pharmacy or Health Dept. Aware to provide a copy of the vaccination record if obtained from local pharmacy or Health Dept. Verbalized acceptance and understanding.  Flu Vaccine status: Declined, Education has been provided regarding the importance of this vaccine but patient still declined. Advised may receive this vaccine at local pharmacy or  Health Dept. Aware to provide a copy of the vaccination record if obtained from local pharmacy or Health Dept. Verbalized acceptance and understanding.  Pneumococcal vaccine status: Declined,  Education has been provided regarding the importance of this vaccine  but patient still declined. Advised may receive this vaccine at local pharmacy or Health Dept. Aware to provide a copy of the vaccination record if obtained from local pharmacy or Health Dept. Verbalized acceptance and understanding.   Covid-19 vaccine status: Declined, Education has been provided regarding the importance of this vaccine but patient still declined. Advised may receive this vaccine at local pharmacy or Health Dept.or vaccine clinic. Aware to provide a copy of the vaccination record if obtained from local pharmacy or Health Dept. Verbalized acceptance and understanding.  Qualifies for Shingles Vaccine? Yes   Zostavax completed No   Shingrix Completed?: No.    Education has been provided regarding the importance of this vaccine. Patient has been advised to call insurance company to determine out of pocket expense if they have not yet received this vaccine. Advised may also receive vaccine at local pharmacy or Health Dept. Verbalized acceptance and understanding.  Screening Tests Health Maintenance  Topic Date Due   DTaP/Tdap/Td (1 - Tdap) Never done   DEXA SCAN  05/02/2022   Medicare Annual Wellness (AWV)  09/16/2023   HPV VACCINES  Aged Out   Pneumonia Vaccine 10+ Years old  Discontinued   INFLUENZA VACCINE  Discontinued   COVID-19 Vaccine  Discontinued   Zoster Vaccines- Shingrix  Discontinued    Health Maintenance  Health Maintenance Due  Topic Date Due   DTaP/Tdap/Td (1 - Tdap) Never done   DEXA SCAN  05/02/2022    Colorectal cancer screening: No longer required.   Mammogram status: No longer required due to age.    Lung Cancer Screening: (Low Dose CT Chest recommended if Age 35-80 years, 30 pack-year currently smoking OR have quit w/in 15years.) does not qualify.   Additional Screening:  Hepatitis C Screening: does not qualify; Completed no  Vision Screening: Recommended annual ophthalmology exams for early detection of glaucoma and other disorders of the  eye. Is the patient up to date with their annual eye exam?  Yes  Who is the provider or what is the name of the office in which the patient attends annual eye exams? [facility] in [place]  If pt is not established with a provider, would they like to be referred to a provider to establish care? No .   Dental Screening: Recommended annual dental exams for proper oral hygiene  Community Resource Referral / Chronic Care Management: CRR required this visit?  No   CCM required this visit?  No      Plan:     I have personally reviewed and noted the following in the patient's chart:   Medical and social history Use of alcohol, tobacco or illicit drugs  Current medications and supplements including opioid prescriptions. Patient is not currently taking opioid prescriptions. Functional ability and status Nutritional status Physical activity Advanced directives List of other physicians Hospitalizations, surgeries, and ER visits in previous 12 months Vitals Screenings to include cognitive, depression, and falls Referrals and appointments  In addition, I have reviewed and discussed with patient certain preventive protocols, quality metrics, and best practice recommendations. A written personalized care plan for preventive services as well as general preventive health recommendations were provided to patient.     Dionisio David, LPN   1/0/2725   Nurse Notes: none

## 2022-09-15 NOTE — Patient Instructions (Signed)
Megan Woods , Thank you for taking time to come for your Medicare Wellness Visit. I appreciate your ongoing commitment to your health goals. Please review the following plan we discussed and let me know if I can assist you in the future.   Screening recommendations/referrals: Colonoscopy: aged out Mammogram: aged out Bone Density: aged out Recommended yearly ophthalmology/optometry visit for glaucoma screening and checkup Recommended yearly dental visit for hygiene and checkup  Vaccinations: Influenza vaccine: n/d Pneumococcal vaccine: n/d Tdap vaccine: n/d Shingles vaccine: n/d   Covid-19:n/d  Advanced directives: no  Conditions/risks identified: none  Next appointment: Follow up in one year for your annual wellness visit 09/19/13 @ 8:15 am by phone   Preventive Care 14 Years and Older, Female Preventive care refers to lifestyle choices and visits with your health care provider that can promote health and wellness. What does preventive care include? A yearly physical exam. This is also called an annual well check. Dental exams once or twice a year. Routine eye exams. Ask your health care provider how often you should have your eyes checked. Personal lifestyle choices, including: Daily care of your teeth and gums. Regular physical activity. Eating a healthy diet. Avoiding tobacco and drug use. Limiting alcohol use. Practicing safe sex. Taking low-dose aspirin every day. Taking vitamin and mineral supplements as recommended by your health care provider. What happens during an annual well check? The services and screenings done by your health care provider during your annual well check will depend on your age, overall health, lifestyle risk factors, and family history of disease. Counseling  Your health care provider may ask you questions about your: Alcohol use. Tobacco use. Drug use. Emotional well-being. Home and relationship well-being. Sexual activity. Eating  habits. History of falls. Memory and ability to understand (cognition). Work and work Statistician. Reproductive health. Screening  You may have the following tests or measurements: Height, weight, and BMI. Blood pressure. Lipid and cholesterol levels. These may be checked every 5 years, or more frequently if you are over 34 years old. Skin check. Lung cancer screening. You may have this screening every year starting at age 9 if you have a 30-pack-year history of smoking and currently smoke or have quit within the past 15 years. Fecal occult blood test (FOBT) of the stool. You may have this test every year starting at age 74. Flexible sigmoidoscopy or colonoscopy. You may have a sigmoidoscopy every 5 years or a colonoscopy every 10 years starting at age 47. Hepatitis C blood test. Hepatitis B blood test. Sexually transmitted disease (STD) testing. Diabetes screening. This is done by checking your blood sugar (glucose) after you have not eaten for a while (fasting). You may have this done every 1-3 years. Bone density scan. This is done to screen for osteoporosis. You may have this done starting at age 57. Mammogram. This may be done every 1-2 years. Talk to your health care provider about how often you should have regular mammograms. Talk with your health care provider about your test results, treatment options, and if necessary, the need for more tests. Vaccines  Your health care provider may recommend certain vaccines, such as: Influenza vaccine. This is recommended every year. Tetanus, diphtheria, and acellular pertussis (Tdap, Td) vaccine. You may need a Td booster every 10 years. Zoster vaccine. You may need this after age 18. Pneumococcal 13-valent conjugate (PCV13) vaccine. One dose is recommended after age 42. Pneumococcal polysaccharide (PPSV23) vaccine. One dose is recommended after age 26. Talk to your health  care provider about which screenings and vaccines you need and how  often you need them. This information is not intended to replace advice given to you by your health care provider. Make sure you discuss any questions you have with your health care provider. Document Released: 09/20/2015 Document Revised: 05/13/2016 Document Reviewed: 06/25/2015 Elsevier Interactive Patient Education  2017 [name] Prevention in the Home Falls can cause injuries. They can happen to people of all ages. There are many things you can do to make your home safe and to help prevent falls. What can I do on the outside of my home? Regularly fix the edges of walkways and driveways and fix any cracks. Remove anything that might make you trip as you walk through a door, such as a raised step or threshold. Trim any bushes or trees on the path to your home. Use bright outdoor lighting. Clear any walking paths of anything that might make someone trip, such as rocks or tools. Regularly check to see if handrails are loose or broken. Make sure that both sides of any steps have handrails. Any raised decks and porches should have guardrails on the edges. Have any leaves, snow, or ice cleared regularly. Use sand or salt on walking paths during winter. Clean up any spills in your garage right away. This includes oil or grease spills. What can I do in the bathroom? Use night lights. Install grab bars by the toilet and in the tub and shower. Do not use towel bars as grab bars. Use non-skid mats or decals in the tub or shower. If you need to sit down in the shower, use a plastic, non-slip stool. Keep the floor dry. Clean up any water that spills on the floor as soon as it happens. Remove soap buildup in the tub or shower regularly. Attach bath mats securely with double-sided non-slip rug tape. Do not have throw rugs and other things on the floor that can make you trip. What can I do in the bedroom? Use night lights. Make sure that you have a light by your bed that is easy to  reach. Do not use any sheets or blankets that are too big for your bed. They should not hang down onto the floor. Have a firm chair that has side arms. You can use this for support while you get dressed. Do not have throw rugs and other things on the floor that can make you trip. What can I do in the kitchen? Clean up any spills right away. Avoid walking on wet floors. Keep items that you use a lot in easy-to-reach places. If you need to reach something above you, use a strong step stool that has a grab bar. Keep electrical cords out of the way. Do not use floor polish or wax that makes floors slippery. If you must use wax, use non-skid floor wax. Do not have throw rugs and other things on the floor that can make you trip. What can I do with my stairs? Do not leave any items on the stairs. Make sure that there are handrails on both sides of the stairs and use them. Fix handrails that are broken or loose. Make sure that handrails are as long as the stairways. Check any carpeting to make sure that it is firmly attached to the stairs. Fix any carpet that is loose or worn. Avoid having throw rugs at the top or bottom of the stairs. If you do have throw rugs, attach them to  the floor with carpet tape. Make sure that you have a light switch at the top of the stairs and the bottom of the stairs. If you do not have them, ask someone to add them for you. What else can I do to help prevent falls? Wear shoes that: Do not have high heels. Have rubber bottoms. Are comfortable and fit you well. Are closed at the toe. Do not wear sandals. If you use a stepladder: Make sure that it is fully opened. Do not climb a closed stepladder. Make sure that both sides of the stepladder are locked into place. Ask someone to hold it for you, if possible. Clearly mark and make sure that you can see: Any grab bars or handrails. First and last steps. Where the edge of each step is. Use tools that help you move  around (mobility aids) if they are needed. These include: Canes. Walkers. Scooters. Crutches. Turn on the lights when you go into a dark area. Replace any light bulbs as soon as they burn out. Set up your furniture so you have a clear path. Avoid moving your furniture around. If any of your floors are uneven, fix them. If there are any pets around you, be aware of where they are. Review your medicines with your doctor. Some medicines can make you feel dizzy. This can increase your chance of falling. Ask your doctor what other things that you can do to help prevent falls. This information is not intended to replace advice given to you by your health care provider. Make sure you discuss any questions you have with your health care provider. Document Released: 06/20/2009 Document Revised: 01/30/2016 Document Reviewed: 09/28/2014 Elsevier Interactive Patient Education  2017 [employer].

## 2022-10-13 ENCOUNTER — Other Ambulatory Visit: Payer: Self-pay | Admitting: Family Medicine

## 2022-10-17 ENCOUNTER — Other Ambulatory Visit: Payer: Self-pay | Admitting: Family Medicine

## 2022-10-27 ENCOUNTER — Other Ambulatory Visit: Payer: Self-pay | Admitting: Family Medicine

## 2022-10-27 NOTE — Telephone Encounter (Signed)
Medication Refill - Medication: estradiol (VIVELLE-DOT) 0.0375 MG/24HR   Has the patient contacted their pharmacy? Yes.   Pt was referred to PCP.   Preferred Pharmacy (with phone number or street name):  [facility], [place] Phone: [phone]  Fax: [phone]     Has the patient been seen for an appointment in the last year OR does the patient have an upcoming appointment? Yes.    Agent: Please be advised that RX refills may take up to 3 business days. We ask that you follow-up with your pharmacy.

## 2022-10-28 NOTE — Telephone Encounter (Signed)
Pts daughter is coming in tomorrow from [facility] and wants to go to get medications for the pt / pt called to see if the refill for  estradiol (VIVELLE-DOT) 0.0375 MG/24HR    Was sent to the pharmacy / please advise when sent

## 2022-10-28 NOTE — Telephone Encounter (Signed)
Requested medication (s) are due for refill today: yes  Requested medication (s) are on the active medication list: no  Last refill:  07/08/18  Future visit scheduled: yes  Notes to clinic:  Unable to refill per protocol, last refill by another provider.  Historical provider.     Requested Prescriptions  Pending Prescriptions Disp Refills   estradiol (VIVELLE-DOT) 0.0375 MG/24HR 8 patch     Sig: Place 1 patch onto the skin 2 (two) times a week.     OB/GYN:  Estrogens Failed - 10/27/2022  9:23 AM      Failed - Mammogram is up-to-date per Health Maintenance      Passed - Last BP in normal range    BP Readings from Last 1 Encounters:  06/23/22 129/84         Passed - Valid encounter within last 12 months    Recent Outpatient Visits           4 months ago Acute bilateral thoracic back pain   [facility] [place], Dionne Bucy, MD   8 months ago    [facility], Dionne Bucy, MD   8 months ago Essential hypertension   [facility] [place], Dionne Bucy, MD   1 year ago Encounter for annual wellness visit (AWV) in Medicare patient   [facility] [place], Dionne Bucy, MD   1 year ago Essential hypertension   [facility] [place], Dionne Bucy, MD

## 2022-10-29 MED ORDER — ESTRADIOL 0.0375 MG/24HR TD PTTW: 1.0000 | MEDICATED_PATCH | TRANSDERMAL | 5 refills | Status: DC

## 2022-12-01 ENCOUNTER — Telehealth: Payer: Self-pay

## 2022-12-01 DIAGNOSIS — M810 Age-related osteoporosis without current pathological fracture: Secondary | ICD-10-CM

## 2022-12-01 NOTE — Telephone Encounter (Signed)
Copied from [place] [id number]. Topic: Appointment Scheduling - Scheduling Inquiry for Clinic >> Dec 01, 2022 10:39 AM Denman George T wrote: Reason for CRM:  Patient called stated she will be back in [place] and would like to schedule a bone density test either on May 13th or May 14th. Please reach out to her at [phone]

## 2022-12-02 NOTE — Telephone Encounter (Signed)
Order placed will fax to [facility] [phone].

## 2023-01-18 ENCOUNTER — Ambulatory Visit: Payer: Medicare Other | Admitting: Family Medicine

## 2023-01-18 DIAGNOSIS — M810 Age-related osteoporosis without current pathological fracture: Secondary | ICD-10-CM | POA: Diagnosis not present

## 2023-01-19 ENCOUNTER — Encounter: Payer: Self-pay | Admitting: Family Medicine

## 2023-01-19 ENCOUNTER — Ambulatory Visit (INDEPENDENT_AMBULATORY_CARE_PROVIDER_SITE_OTHER): Payer: Medicare Other | Admitting: Family Medicine

## 2023-01-19 VITALS — BP 135/69 | HR 60 | Temp 97.8°F | Resp 12 | Ht 62.0 in | Wt 118.6 lb

## 2023-01-19 DIAGNOSIS — R739 Hyperglycemia, unspecified: Secondary | ICD-10-CM | POA: Diagnosis not present

## 2023-01-19 DIAGNOSIS — I10 Essential (primary) hypertension: Secondary | ICD-10-CM

## 2023-01-19 DIAGNOSIS — M810 Age-related osteoporosis without current pathological fracture: Secondary | ICD-10-CM

## 2023-01-19 DIAGNOSIS — E039 Hypothyroidism, unspecified: Secondary | ICD-10-CM

## 2023-01-19 DIAGNOSIS — E782 Mixed hyperlipidemia: Secondary | ICD-10-CM

## 2023-01-19 LAB — HM DEXA SCAN

## 2023-01-19 MED ORDER — HYDROCHLOROTHIAZIDE 12.5 MG PO TABS: 12.5000 mg | ORAL_TABLET | Freq: Every day | ORAL | 3 refills | Status: DC

## 2023-01-19 MED ORDER — CIPROFLOXACIN HCL 500 MG PO TABS: 500.0000 mg | ORAL_TABLET | Freq: Two times a day (BID) | ORAL | 0 refills | Status: AC

## 2023-01-19 MED ORDER — LEVOTHYROXINE SODIUM 100 MCG PO TABS: 100.0000 ug | ORAL_TABLET | Freq: Every day | ORAL | 3 refills | Status: DC

## 2023-01-19 NOTE — Assessment & Plan Note (Signed)
Well controlled Continue current medications Recheck metabolic panel F/u in 6 months  

## 2023-01-19 NOTE — Progress Notes (Signed)
I,Sulibeya S Dimas,acting as a Neurosurgeon for Shirlee Latch, MD.,have documented all relevant documentation on the behalf of Shirlee Latch, MD,as directed by  Shirlee Latch, MD while in the presence of Shirlee Latch, MD.    Established visit   Patient: Megan Woods   DOB: 1934-10-01   88 y.o. Female  MRN: [id number] Visit Date: 01/19/2023  Today's healthcare provider: Shirlee Latch, MD   Chief Complaint  Patient presents with   Hypertension   Hypothyroidism   Hyperlipidemia   Subjective    Megan Woods is a 88 y.o. female who presents today for a yearly.   She reports consuming a general diet. Home exercise routine includes walk, stretching, and chair aerobics. She generally feels well. She reports sleeping well. She does not have additional problems to discuss today.   HPI  09/15/2022 AWV  Had DEXA this AM in [place].  Past Medical History:  Diagnosis Date   Allergy    Arthritis    Dry age-related macular degeneration    Hypertension    Hypothyroidism    Mitral valve disorder    Osteoporosis    Past Surgical History:  Procedure Laterality Date   ABDOMINAL HYSTERECTOMY  1975   also took out L ovary and cervix   APPENDECTOMY     CATARACT EXTRACTION     EYE SURGERY     Cataracts   OOPHORECTOMY     R ovary removed many years after hysterectomy for benign cyst   SMALL BOWEL REPAIR     perforated during oophorectomy   TONSILLECTOMY     Social History   Socioeconomic History   Marital status: Widowed    Spouse name: Not on file   Number of children: 3   Years of education: Not on file   Highest education level: Master's degree (e.g., MA, MS, MEng, MEd, MSW, MBA)  Occupational History   Occupation: retired  Tobacco Use   Smoking status: Never   Smokeless tobacco: Never  Vaping Use   Vaping Use: Never used  Substance and Sexual Activity   Alcohol use: Yes    Alcohol/week: 4.0 standard drinks of alcohol    Types: 4 Glasses of wine  per week   Drug use: Never   Sexual activity: Not on file  Other Topics Concern   Not on file  Social History Narrative   Not on file   Social Determinants of Health   Financial Resource Strain: Low Risk  (01/17/2023)   Overall Financial Resource Strain (CARDIA)    Difficulty of Paying Living Expenses: Not hard at all  Food Insecurity: No Food Insecurity (01/17/2023)   Hunger Vital Sign    Worried About Running Out of Food in the Last Year: Never true    Ran Out of Food in the Last Year: Never true  Transportation Needs: No Transportation Needs (01/17/2023)   PRAPARE - Administrator, Civil Service (Medical): No    Lack of Transportation (Non-Medical): No  Physical Activity: Sufficiently Active (01/17/2023)   Exercise Vital Sign    Days of Exercise per Week: 6 days    Minutes of Exercise per Session: 40 min  Stress: No Stress Concern Present (01/17/2023)   [employer] of Occupational Health - Occupational Stress Questionnaire    Feeling of Stress : Not at all  Social Connections: Moderately Integrated (01/17/2023)   Social Connection and Isolation Panel [NHANES]    Frequency of Communication with Friends and Family: More than three times a week  Frequency of Social Gatherings with Friends and Family: More than three times a week    Attends Religious Services: More than 4 times per year    Active Member of Clubs or Organizations: Yes    Attends Banker Meetings: More than 4 times per year    Marital Status: Widowed  Intimate Partner Violence: Not At Risk (09/15/2022)   Humiliation, Afraid, Rape, and Kick questionnaire    Fear of Current or Ex-Partner: No    Emotionally Abused: No    Physically Abused: No    Sexually Abused: No   Family Status  Relation Name Status   Mother  Deceased       car accident   Father  Deceased       car accident    Sister  (Not Specified)   Family History  Problem Relation Age of Onset   Breast cancer Sister 69    Allergies  Allergen Reactions   Shellfish Allergy Hives    Other reaction(s): other/intolerance "I pass out" Other reaction(s): other/intolerance "I pass out"   Sulfur Hives    Other reaction(s): mild rash/itching   Elemental Sulfur     Other reaction(s): mild rash/itching   Iodine     Other reaction(s): mild rash/itching   Penicillins Swelling    Other reaction(s): mild rash/itching   Chocolate     Other reaction(s): other/intolerance Other reaction(s): GI Upset Other reaction(s): other/intolerance    Patient Care Team: Erasmo Downer, MD as PCP - General (Family Medicine) Romine, Edwena Felty, MD (Obstetrics and Gynecology)   Medications: Outpatient Medications Prior to Visit  Medication Sig   Calcium Carbonate (CALCIUM 600 PO) Take 600 mg by mouth daily.    Cholecalciferol (VITAMIN D PO) Take 1,000 mg by mouth daily.   estradiol (VIVELLE-DOT) 0.0375 MG/24HR Place 1 patch onto the skin 2 (two) times a week.   [DISCONTINUED] hydrochlorothiazide (HYDRODIURIL) 12.5 MG tablet Take 1 tablet (12.5 mg total) by mouth daily. Please schedule office visit before any future refill.   [DISCONTINUED] levothyroxine (SYNTHROID) 100 MCG tablet Take 1 tablet (100 mcg total) by mouth daily before breakfast. Please schedule office visit before any future refill.   [DISCONTINUED] lidocaine (LIDODERM) 5 % Place 1 patch onto the skin every 12 (twelve) hours. Remove & Discard patch within 12 hours or as directed by MD   No facility-administered medications prior to visit.    Review of Systems  HENT:  Positive for dental problem and hearing loss.   All other systems reviewed and are negative.   Last CBC Lab Results  Component Value Date   WBC 6.6 06/21/2022   HGB 14.2 06/21/2022   HCT 43.7 06/21/2022   MCV 94.2 06/21/2022   MCH 30.6 06/21/2022   RDW 12.4 06/21/2022   PLT 294 06/21/2022   Last metabolic panel Lab Results  Component Value Date   GLUCOSE 117 (H) 06/21/2022   NA  139 06/21/2022   K 3.8 06/21/2022   CL 106 06/21/2022   CO2 28 06/21/2022   BUN 13 06/21/2022   CREATININE 0.91 06/21/2022   GFRNONAA >60 06/21/2022   CALCIUM 9.1 06/21/2022   PROT 5.9 (L) 02/05/2022   ALBUMIN 4.3 02/05/2022   LABGLOB 1.6 02/05/2022   AGRATIO 2.7 (H) 02/05/2022   BILITOT 0.5 02/05/2022   ALKPHOS 64 02/05/2022   AST 16 02/05/2022   ALT 13 02/05/2022   ANIONGAP 5 06/21/2022   Last lipids Lab Results  Component Value Date   CHOL 186  02/05/2022   HDL 83 02/05/2022   LDLCALC 93 02/05/2022   TRIG 50 02/05/2022   CHOLHDL 2.2 02/05/2022   Last thyroid functions Lab Results  Component Value Date   TSH 1.640 02/05/2022      Objective    BP 135/69 (BP Location: Left Arm, Patient Position: Sitting, Cuff Size: Normal)   Pulse 60   Temp 97.8 F (36.6 C) (Temporal)   Resp 12   Ht 5\' 2"  (1.575 m)   Wt 118 lb 9.6 oz (53.8 kg)   BMI 21.69 kg/m  BP Readings from Last 3 Encounters:  01/19/23 135/69  06/23/22 129/84  06/21/22 (!) 153/83   Wt Readings from Last 3 Encounters:  01/19/23 118 lb 9.6 oz (53.8 kg)  09/15/22 120 lb (54.4 kg)  06/23/22 120 lb 12.8 oz (54.8 kg)      Physical Exam Vitals reviewed.  Constitutional:      General: She is not in acute distress.    Appearance: Normal appearance. She is well-developed. She is not diaphoretic.  HENT:     Head: Normocephalic and atraumatic.     Right Ear: Tympanic membrane, ear canal and external ear normal.     Left Ear: Tympanic membrane, ear canal and external ear normal.     Nose: Nose normal.     Mouth/Throat:     Mouth: Mucous membranes are moist.     Pharynx: Oropharynx is clear. No oropharyngeal exudate.  Eyes:     General: No scleral icterus.    Conjunctiva/sclera: Conjunctivae normal.     Pupils: Pupils are equal, round, and reactive to light.  Neck:     Thyroid: No thyromegaly.  Cardiovascular:     Rate and Rhythm: Normal rate and regular rhythm.     Heart sounds: Normal heart sounds.  No murmur heard. Pulmonary:     Effort: Pulmonary effort is normal. No respiratory distress.     Breath sounds: Normal breath sounds. No wheezing or rales.  Abdominal:     General: There is no distension.     Palpations: Abdomen is soft.     Tenderness: There is no abdominal tenderness.  Musculoskeletal:        General: No deformity.     Cervical back: Neck supple.     Right lower leg: No edema.     Left lower leg: No edema.  Lymphadenopathy:     Cervical: No cervical adenopathy.  Skin:    General: Skin is warm and dry.     Findings: No rash.  Neurological:     Mental Status: She is alert and oriented to person, place, and time. Mental status is at baseline.     Gait: Gait normal.  Psychiatric:        Mood and Affect: Mood normal.        Behavior: Behavior normal.        Thought Content: Thought content normal.       Last depression screening scores    01/19/2023    2:05 PM 09/15/2022    8:22 AM 02/05/2022    8:55 AM  PHQ 2/9 Scores  PHQ - 2 Score 0 0 0  PHQ- 9 Score 0 0 0   Last fall risk screening    01/18/2023    2:07 PM  Fall Risk   Falls in the past year? 0  Number falls in past yr: 0  Injury with Fall? 0    No results found for any visits on 01/19/23.  Assessment & Plan     Problem List Items Addressed This Visit       Cardiovascular and Mediastinum   Essential hypertension - Primary    Well controlled Continue current medications Recheck metabolic panel F/u in 6 months       Relevant Medications   hydrochlorothiazide (HYDRODIURIL) 12.5 MG tablet   Other Relevant Orders   Comprehensive metabolic panel     Endocrine   Hypothyroidism    Previously well controlled Continue Synthroid at current dose  Recheck TSH and adjust Synthroid as indicated        Relevant Medications   levothyroxine (SYNTHROID) 100 MCG tablet   Other Relevant Orders   TSH     Musculoskeletal and Integument   Osteoporosis    Recheck Vit D level Reviewed last DEXA -  repeat was this AM Continue supplement and exercise      Relevant Orders   VITAMIN D 25 Hydroxy (Vit-D Deficiency, Fractures)     Other   Moderate mixed hyperlipidemia not requiring statin therapy    Reviewed last lipid panel Not currently on a statin Recheck FLP and CMP Discussed diet and exercise       Relevant Medications   hydrochlorothiazide (HYDRODIURIL) 12.5 MG tablet   Other Relevant Orders   Lipid panel   Comprehensive metabolic panel   Other Visit Diagnoses     Hyperglycemia       Relevant Orders   Hemoglobin A1c        Return in about 6 months (around 07/22/2023) for chronic disease f/u.     I, Shirlee Latch, MD, have reviewed all documentation for this visit. The documentation on 01/19/23 for the exam, diagnosis, procedures, and orders are all accurate and complete.   Willim Turnage, Marzella Schlein, MD, MPH [facility]

## 2023-01-19 NOTE — Assessment & Plan Note (Signed)
Previously well controlled Continue Synthroid at current dose  Recheck TSH and adjust Synthroid as indicated   

## 2023-01-19 NOTE — Assessment & Plan Note (Signed)
Reviewed last lipid panel Not currently on a statin Recheck FLP and CMP Discussed diet and exercise  

## 2023-01-19 NOTE — Assessment & Plan Note (Signed)
Recheck Vit D level Reviewed last DEXA - repeat was this AM Continue supplement and exercise

## 2023-01-20 DIAGNOSIS — H6122 Impacted cerumen, left ear: Secondary | ICD-10-CM | POA: Diagnosis not present

## 2023-01-20 DIAGNOSIS — H60531 Acute contact otitis externa, right ear: Secondary | ICD-10-CM | POA: Diagnosis not present

## 2023-01-20 LAB — COMPREHENSIVE METABOLIC PANEL
ALT: 12 IU/L (ref 0–32)
AST: 19 IU/L (ref 0–40)
Albumin/Globulin Ratio: 2.4 — ABNORMAL HIGH (ref 1.2–2.2)
Albumin: 4.3 g/dL (ref 3.7–4.7)
Alkaline Phosphatase: 76 IU/L (ref 44–121)
BUN/Creatinine Ratio: 20 (ref 12–28)
BUN: 17 mg/dL (ref 8–27)
Bilirubin Total: 0.2 mg/dL (ref 0.0–1.2)
CO2: 27 mmol/L (ref 20–29)
Calcium: 9.9 mg/dL (ref 8.7–10.3)
Chloride: 104 mmol/L (ref 96–106)
Creatinine, Ser: 0.84 mg/dL (ref 0.57–1.00)
Globulin, Total: 1.8 g/dL (ref 1.5–4.5)
Glucose: 96 mg/dL (ref 70–99)
Potassium: 4.5 mmol/L (ref 3.5–5.2)
Sodium: 142 mmol/L (ref 134–144)
Total Protein: 6.1 g/dL (ref 6.0–8.5)
eGFR: 68 mL/min/{1.73_m2} (ref >59)

## 2023-01-20 LAB — LIPID PANEL
Chol/HDL Ratio: 2.6 ratio (ref 0.0–4.4)
Cholesterol, Total: 188 mg/dL (ref 100–199)
HDL: 71 mg/dL (ref >39)
LDL Chol Calc (NIH): 92 mg/dL (ref 0–99)
Triglycerides: 143 mg/dL (ref 0–149)
VLDL Cholesterol Cal: 25 mg/dL (ref 5–40)

## 2023-01-20 LAB — HEMOGLOBIN A1C
Est. average glucose Bld gHb Est-mCnc: 120 mg/dL
Hgb A1c MFr Bld: 5.8 % — ABNORMAL HIGH (ref 4.8–5.6)

## 2023-01-20 LAB — VITAMIN D 25 HYDROXY (VIT D DEFICIENCY, FRACTURES): Vit D, 25-Hydroxy: 35.4 ng/mL (ref 30.0–100.0)

## 2023-01-20 LAB — TSH: TSH: 0.293 u[IU]/mL — ABNORMAL LOW (ref 0.450–4.500)

## 2023-01-21 ENCOUNTER — Telehealth: Payer: Self-pay

## 2023-01-21 ENCOUNTER — Encounter: Payer: Self-pay | Admitting: Family Medicine

## 2023-01-21 NOTE — Telephone Encounter (Signed)
Copied from CRM [id number]. Topic: General - Other >> Jan 21, 2023  1:02 PM Ja-Kwan M wrote: Reason for CRM: Pt requests that a copy of her bone density and lab results be mailed to her. Pt address was verified.

## 2023-01-21 NOTE — Telephone Encounter (Signed)
Left detailed message advising patient of no significant changes in bone density since last checked.

## 2023-01-22 ENCOUNTER — Other Ambulatory Visit: Payer: Self-pay

## 2023-01-22 DIAGNOSIS — E039 Hypothyroidism, unspecified: Secondary | ICD-10-CM

## 2023-01-22 MED ORDER — LEVOTHYROXINE SODIUM 88 MCG PO TABS: 88.0000 ug | ORAL_TABLET | Freq: Every day | ORAL | 1 refills | Status: DC

## 2023-01-22 NOTE — Telephone Encounter (Signed)
Pt called back to expressed understanding.

## 2023-01-22 NOTE — Telephone Encounter (Signed)
-----   Message from Erasmo Downer, MD sent at 01/21/2023  4:44 PM EDT ----- Normal/stable labs, except Hemoglobin A1c, 3 month avg of blood sugars, is in prediabetic range.  In order to prevent progression to diabetes, recommend low carb diet and regular exercise. TSH is also low, which means that Synthroid dose is slightly too high.  Decrease to 88 mcg daily and repeat TSH in 2 months.

## 2023-01-25 MED ORDER — ESTRADIOL 0.0375 MG/24HR TD PTTW: 1.0000 | MEDICATED_PATCH | TRANSDERMAL | 5 refills | Status: DC

## 2023-02-10 ENCOUNTER — Other Ambulatory Visit: Payer: Medicare Other

## 2023-05-27 DIAGNOSIS — H353131 Nonexudative age-related macular degeneration, bilateral, early dry stage: Secondary | ICD-10-CM | POA: Diagnosis not present

## 2023-06-15 ENCOUNTER — Encounter: Payer: Self-pay | Admitting: Family Medicine

## 2023-06-15 DIAGNOSIS — Z1231 Encounter for screening mammogram for malignant neoplasm of breast: Secondary | ICD-10-CM | POA: Diagnosis not present

## 2023-06-15 LAB — HM MAMMOGRAPHY

## 2023-06-17 ENCOUNTER — Ambulatory Visit (INDEPENDENT_AMBULATORY_CARE_PROVIDER_SITE_OTHER): Payer: Medicare Other | Admitting: Family Medicine

## 2023-06-17 ENCOUNTER — Encounter: Payer: Self-pay | Admitting: Family Medicine

## 2023-06-17 VITALS — BP 136/89 | HR 73 | Temp 98.1°F | Resp 16 | Ht 62.0 in | Wt 115.3 lb

## 2023-06-17 DIAGNOSIS — R7303 Prediabetes: Secondary | ICD-10-CM | POA: Diagnosis not present

## 2023-06-17 DIAGNOSIS — I10 Essential (primary) hypertension: Secondary | ICD-10-CM

## 2023-06-17 DIAGNOSIS — E039 Hypothyroidism, unspecified: Secondary | ICD-10-CM

## 2023-06-17 DIAGNOSIS — E782 Mixed hyperlipidemia: Secondary | ICD-10-CM | POA: Diagnosis not present

## 2023-06-17 MED ORDER — ESTRADIOL 0.0375 MG/24HR TD PTTW: 1.0000 | MEDICATED_PATCH | TRANSDERMAL | 5 refills | Status: DC

## 2023-06-17 NOTE — Assessment & Plan Note (Signed)
Stable on current medication regimen. -Continue Hydrochlorothiazide 12.5mg  daily.

## 2023-06-17 NOTE — Assessment & Plan Note (Signed)
Stable on current medication regimen. -Continue Levothyroxine daily.

## 2023-06-17 NOTE — Progress Notes (Signed)
Established Patient Office Visit  Subjective   Patient ID: Megan Woods, female    DOB: 1935-01-28  Age: 88 y.o. MRN: [id number]  Chief Complaint  Patient presents with   Hypertension   Hyperlipidemia   Hypothyroidism    Hypertension  Hyperlipidemia    Discussed the use of AI scribe software for clinical note transcription with the patient, who gave verbal consent to proceed.  History of Present Illness   The patient, a retired individual residing in [place] part-time, presents with a lower back problem. She mentions that she has been careful with her back. She is currently on levothyroxine 88 micrograms a day and hydrochlorothiazide 12.5 mg a day for her existing medical conditions. She also mentions that she is on estrogen dots, for which she needs a refill. The patient also mentions that her blood sugar levels had crept up into the prediabetic range during her last check-up. She attributes this to her current diet while staying at her daughter's house. The patient also mentions her allergies to seafood and fish. She mentions that she has been managing her allergies by avoiding seafood and fish.         ROS per HPI    Objective:     BP 136/89   Pulse 73   Temp 98.1 F (36.7 C)   Resp 16   Ht 5\' 2"  (1.575 m)   Wt 115 lb 4.8 oz (52.3 kg)   SpO2 99%   BMI 21.09 kg/m    Physical Exam Vitals reviewed.  Constitutional:      General: She is not in acute distress.    Appearance: Normal appearance. She is well-developed. She is not diaphoretic.  HENT:     Head: Normocephalic and atraumatic.  Eyes:     General: No scleral icterus.    Conjunctiva/sclera: Conjunctivae normal.  Neck:     Thyroid: No thyromegaly.  Cardiovascular:     Rate and Rhythm: Normal rate and regular rhythm.     Pulses: Normal pulses.     Heart sounds: Normal heart sounds. No murmur heard. Pulmonary:     Effort: Pulmonary effort is normal. No respiratory distress.     Breath sounds: Normal  breath sounds. No wheezing, rhonchi or rales.  Musculoskeletal:     Cervical back: Neck supple.     Right lower leg: No edema.     Left lower leg: No edema.  Lymphadenopathy:     Cervical: No cervical adenopathy.  Skin:    General: Skin is warm and dry.     Findings: No rash.  Neurological:     Mental Status: She is alert and oriented to person, place, and time. Mental status is at baseline.  Psychiatric:        Mood and Affect: Mood normal.        Behavior: Behavior normal.      No results found for any visits on 06/17/23.    The ASCVD Risk score (Arnett DK, et al., 2019) failed to calculate for the following reasons:   The 2019 ASCVD risk score is only valid for ages 31 to 61    Assessment & Plan:   Problem List Items Addressed This Visit       Cardiovascular and Mediastinum   Essential hypertension - Primary    Stable on current medication regimen. -Continue Hydrochlorothiazide 12.5mg  daily.      Relevant Orders   Basic Metabolic Panel (BMET)     Endocrine   Hypothyroidism  Stable on current medication regimen. -Continue Levothyroxine daily.      Relevant Orders   TSH     Other   Moderate mixed hyperlipidemia not requiring statin therapy   Prediabetes    Recommend low carb diet Recheck A1c       Relevant Orders   Hemoglobin A1c        Lower Back Pain Chronic condition, currently stable. Patient has requested a handicapped parking permit due to difficulty with mobility related to this condition. -Complete and provide [place] handicapped parking permit form for patient.  Menopausal Symptoms Stable on current hormone replacement therapy. -Refill Estradiol patches, send prescription to [facility] on [address].  General Health Maintenance / Followup Plans -Order labs today for thyroid function, kidney function, and blood glucose. -Plan for gynecological check in May. -Schedule follow-up appointment in May.        Return in about 6  months (around 12/16/2023) for chronic disease f/u with pelvic exam.    Shirlee Latch, MD

## 2023-06-17 NOTE — Assessment & Plan Note (Signed)
Recommend low carb diet °Recheck A1c  °

## 2023-06-18 LAB — HEMOGLOBIN A1C
Est. average glucose Bld gHb Est-mCnc: 120 mg/dL
Hgb A1c MFr Bld: 5.8 % — ABNORMAL HIGH (ref 4.8–5.6)

## 2023-06-18 LAB — BASIC METABOLIC PANEL
BUN/Creatinine Ratio: 18 (ref 12–28)
BUN: 16 mg/dL (ref 8–27)
CO2: 24 mmol/L (ref 20–29)
Calcium: 9.5 mg/dL (ref 8.7–10.3)
Chloride: 102 mmol/L (ref 96–106)
Creatinine, Ser: 0.91 mg/dL (ref 0.57–1.00)
Glucose: 116 mg/dL — ABNORMAL HIGH (ref 70–99)
Potassium: 4.4 mmol/L (ref 3.5–5.2)
Sodium: 141 mmol/L (ref 134–144)
eGFR: 61 mL/min/{1.73_m2} (ref >59)

## 2023-06-18 LAB — TSH: TSH: 1.23 u[IU]/mL (ref 0.450–4.500)

## 2023-07-22 DIAGNOSIS — H353112 Nonexudative age-related macular degeneration, right eye, intermediate dry stage: Secondary | ICD-10-CM | POA: Diagnosis not present

## 2023-07-22 DIAGNOSIS — H353222 Exudative age-related macular degeneration, left eye, with inactive choroidal neovascularization: Secondary | ICD-10-CM | POA: Diagnosis not present

## 2023-08-05 ENCOUNTER — Other Ambulatory Visit: Payer: Self-pay | Admitting: Family Medicine

## 2023-08-05 DIAGNOSIS — E039 Hypothyroidism, unspecified: Secondary | ICD-10-CM

## 2023-08-09 NOTE — Telephone Encounter (Signed)
Copied from CRM [id number]. Topic: General - Other >> Aug 09, 2023  8:31 AM Everette C wrote: Reason for CRM: The patient would like to be contacted by a member of staff when possible to follow up on a previous refill request of their levothyroxine (SYNTHROID) 88 MCG tablet [[id number]]  Please contact the patient further when possible   The patient would like to ensure that they're receiving a 90 day prescription

## 2023-08-09 NOTE — Telephone Encounter (Signed)
Prescription sent in today, qty:90 R:1

## 2023-09-22 ENCOUNTER — Ambulatory Visit: Payer: Medicare Other | Admitting: Emergency Medicine

## 2023-09-22 VITALS — Ht 64.0 in | Wt 118.0 lb

## 2023-09-22 DIAGNOSIS — Z0000 Encounter for general adult medical examination without abnormal findings: Secondary | ICD-10-CM | POA: Diagnosis not present

## 2023-09-22 DIAGNOSIS — D225 Melanocytic nevi of trunk: Secondary | ICD-10-CM | POA: Diagnosis not present

## 2023-09-22 DIAGNOSIS — L814 Other melanin hyperpigmentation: Secondary | ICD-10-CM | POA: Diagnosis not present

## 2023-09-22 DIAGNOSIS — L821 Other seborrheic keratosis: Secondary | ICD-10-CM | POA: Diagnosis not present

## 2023-09-22 DIAGNOSIS — Z85828 Personal history of other malignant neoplasm of skin: Secondary | ICD-10-CM | POA: Diagnosis not present

## 2023-09-22 DIAGNOSIS — Z08 Encounter for follow-up examination after completed treatment for malignant neoplasm: Secondary | ICD-10-CM | POA: Diagnosis not present

## 2023-09-22 DIAGNOSIS — Z7189 Other specified counseling: Secondary | ICD-10-CM | POA: Diagnosis not present

## 2023-09-22 DIAGNOSIS — L233 Allergic contact dermatitis due to drugs in contact with skin: Secondary | ICD-10-CM | POA: Diagnosis not present

## 2023-09-22 NOTE — Progress Notes (Signed)
Subjective:   Megan Woods is a 89 y.o. female who presents for Medicare Annual (Subsequent) preventive examination.  Visit Complete: Virtual I connected with  Nils Flack on 09/22/23 by a video and audio enabled telemedicine application and verified that I am speaking with the correct person using two identifiers.  Patient Location: Home  Provider Location: Home Office  I discussed the limitations of evaluation and management by telemedicine. The patient expressed understanding and agreed to proceed.  Vital Signs: Because this visit was a virtual/telehealth visit, some criteria may be missing or patient reported. Any vitals not documented were not able to be obtained and vitals that have been documented are patient reported.  Cardiac Risk Factors include: advanced age (>16men, >94 women);hypertension;dyslipidemia;Other (see comment), Risk factor comments: prediabetic     Objective:    Today's Vitals   09/22/23 1543  Weight: 118 lb (53.5 kg)  Height: 5\' 4"  (1.626 m)   Body mass index is 20.25 kg/m.     09/22/2023    3:54 PM 09/15/2022    8:24 AM 06/21/2022    2:48 PM 01/15/2020   10:32 AM 01/09/2019    2:48 PM  Advanced Directives  Does Patient Have a Medical Advance Directive? Yes No No Yes Yes  Type of Estate agent of [place];Living will   Healthcare Power of [place];Living will Healthcare Power of [place];Living will  Does patient want to make changes to medical advance directive? No - Patient declined      Copy of Healthcare Power of Attorney in Chart? No - copy requested   No - copy requested No - copy requested  Would patient like information on creating a medical advance directive?  No - Patient declined No - Patient declined      Current Medications (verified) Outpatient Encounter Medications as of 09/22/2023  Medication Sig   Calcium Carbonate (CALCIUM 600 PO) Take 600 mg by mouth daily.    Cholecalciferol (VITAMIN D PO) Take 1,000 mg by  mouth daily.   estradiol (VIVELLE-DOT) 0.0375 MG/24HR Place 1 patch onto the skin 2 (two) times a week.   hydrochlorothiazide (HYDRODIURIL) 12.5 MG tablet Take 1 tablet (12.5 mg total) by mouth daily.   levothyroxine (SYNTHROID) 88 MCG tablet TAKE 1 TABLET BY MOUTH ONCE DAILY BEFORE BREAKFAST. STOP LEVOTHYROXINE DOSAGE   No facility-administered encounter medications on file as of 09/22/2023.    Allergies (verified) Shellfish allergy, Sulfur, Elemental sulfur, Iodine, Penicillins, and Chocolate   History: Past Medical History:  Diagnosis Date   Allergy    Arthritis    Dry age-related macular degeneration    Hypertension    Hypothyroidism    Mitral valve disorder    Osteoporosis    Past Surgical History:  Procedure Laterality Date   ABDOMINAL HYSTERECTOMY  1975   also took out L ovary and cervix   APPENDECTOMY     CATARACT EXTRACTION     EYE SURGERY     Cataracts   OOPHORECTOMY     R ovary removed many years after hysterectomy for benign cyst   SMALL BOWEL REPAIR     perforated during oophorectomy   TONSILLECTOMY     Family History  Problem Relation Age of Onset   Other Mother        unknown medical history   Other Father        unknown medical history   Breast cancer Sister 62   Social History   Socioeconomic History   Marital status: Widowed  Spouse name: Not on file   Number of children: 3   Years of education: Not on file   Highest education level: Master's degree (e.g., MA, MS, MEng, MEd, MSW, MBA)  Occupational History   Occupation: retired  Tobacco Use   Smoking status: Never   Smokeless tobacco: Never  Vaping Use   Vaping status: Never Used  Substance and Sexual Activity   Alcohol use: Not Currently    Comment: 1 glass of wine twice a month   Drug use: Never   Sexual activity: Not on file  Other Topics Concern   Not on file  Social History Narrative   Not on file   Social Drivers of Health   Financial Resource Strain: Low Risk   (09/22/2023)   Overall Financial Resource Strain (CARDIA)    Difficulty of Paying Living Expenses: Not hard at all  Food Insecurity: No Food Insecurity (09/22/2023)   Hunger Vital Sign    Worried About Running Out of Food in the Last Year: Never true    Ran Out of Food in the Last Year: Never true  Transportation Needs: No Transportation Needs (09/22/2023)   PRAPARE - Administrator, Civil Service (Medical): No    Lack of Transportation (Non-Medical): No  Physical Activity: Sufficiently Active (09/22/2023)   Exercise Vital Sign    Days of Exercise per Week: 5 days    Minutes of Exercise per Session: 50 min  Stress: No Stress Concern Present (09/22/2023)   [employer] of Occupational Health - Occupational Stress Questionnaire    Feeling of Stress : Not at all  Social Connections: Moderately Integrated (09/22/2023)   Social Connection and Isolation Panel [NHANES]    Frequency of Communication with Friends and Family: More than three times a week    Frequency of Social Gatherings with Friends and Family: More than three times a week    Attends Religious Services: More than 4 times per year    Active Member of [employer] or Organizations: Yes    Attends Banker Meetings: More than 4 times per year    Marital Status: Widowed    Tobacco Counseling Counseling given: Not Answered   Clinical Intake:  Pre-visit preparation completed: Yes  Pain : No/denies pain     BMI - recorded: 20.25 Nutritional Status: BMI of 19-24  Normal Nutritional Risks: None Diabetes: No  How often do you need to have someone help you when you read instructions, pamphlets, or other written materials from your doctor or pharmacy?: 1 - Never  Interpreter Needed?: No  Information entered by :: Tora Kindred, CMA   Activities of Daily Living    09/22/2023    3:45 PM 01/18/2023    2:07 PM  In your present state of health, do you have any difficulty performing the following activities:   Hearing? 0 1  Vision? 0 0  Difficulty concentrating or making decisions? 0 0  Walking or climbing stairs? 0 0  Dressing or bathing? 0 0  Doing errands, shopping? 0 0  Preparing Food and eating ? N N  Using the Toilet? N N  In the past six months, have you accidently leaked urine? N N  Do you have problems with loss of bowel control? N Y  Managing your Medications? N N  Managing your Finances? N N  Housekeeping or managing your Housekeeping? N N    Patient Care Team: Erasmo Downer, MD as PCP - General (Family Medicine) Romine, Edwena Felty, MD (  Obstetrics and Gynecology)  Indicate any recent Medical Services you may have received from other than [facility] providers in the past year (date may be approximate).     Assessment:   This is a routine wellness examination for [place].  Hearing/Vision screen Hearing Screening - Comments:: Wears hearing aids Vision Screening - Comments:: Gets eye exams   Goals Addressed             This Visit's Progress    Patient Stated       Stay active and healthy      Depression Screen    09/22/2023    3:53 PM 06/17/2023   11:49 AM 01/19/2023    2:05 PM 09/15/2022    8:22 AM 02/05/2022    8:55 AM 08/21/2021    3:14 PM 01/21/2021   11:12 AM  PHQ 2/9 Scores  PHQ - 2 Score 0 0 0 0 0 0 0  PHQ- 9 Score  0 0 0 0 0 0    Fall Risk    09/22/2023    3:50 PM 06/17/2023   11:49 AM 01/18/2023    2:07 PM 09/15/2022    8:25 AM 02/05/2022    8:55 AM  Fall Risk   Falls in the past year? 0 0 0 1 1  Number falls in past yr: 0  0 0 0  Injury with Fall? 0 0 0 0 0  Risk for fall due to : No Fall Risks   History of fall(s) History of fall(s)  Follow up Falls prevention discussed   Falls prevention discussed;Falls evaluation completed Falls evaluation completed;Education provided;Falls prevention discussed    MEDICARE RISK AT HOME: Medicare Risk at Home Any stairs in or around the home?: Yes If so, are there any without handrails?: No Home free of loose  throw rugs in walkways, pet beds, electrical cords, etc?: Yes Adequate lighting in your home to reduce risk of falls?: Yes Life alert?: No Use of a cane, walker or w/c?: No Grab bars in the bathroom?: Yes Shower chair or bench in shower?: No Elevated toilet seat or a handicapped toilet?: Yes  TIMED UP AND GO:  Was the test performed?  No    Cognitive Function:        09/22/2023    3:55 PM 09/15/2022    8:27 AM 08/21/2021    2:12 PM 01/09/2019    2:55 PM  6CIT Screen  What Year? 0 points 0 points 0 points 0 points  What month? 0 points 0 points 0 points 0 points  What time? 0 points 0 points 0 points 0 points  Count back from 20 0 points 0 points 0 points 0 points  Months in reverse 0 points 0 points 0 points 0 points  Repeat phrase 0 points 0 points 4 points 0 points  Total Score 0 points 0 points 4 points 0 points    Immunizations  There is no immunization history on file for this patient.  TDAP Status: patient declined  Flu Vaccine status: Declined, Education has been provided regarding the importance of this vaccine but patient still declined. Advised may receive this vaccine at local pharmacy or Health Dept. Aware to provide a copy of the vaccination record if obtained from local pharmacy or Health Dept. Verbalized acceptance and understanding.  Pneumococcal vaccine status: Declined,  Education has been provided regarding the importance of this vaccine but patient still declined. Advised may receive this vaccine at local pharmacy or Health Dept. Aware to provide a  copy of the vaccination record if obtained from local pharmacy or Health Dept. Verbalized acceptance and understanding.   Covid-19 vaccine status: Declined, Education has been provided regarding the importance of this vaccine but patient still declined. Advised may receive this vaccine at local pharmacy or Health Dept.or vaccine clinic. Aware to provide a copy of the vaccination record if obtained from local pharmacy  or Health Dept. Verbalized acceptance and understanding.  Qualifies for Shingles Vaccine? Yes   Zostavax completed No   Shingrix Completed?: No.    Education has been provided regarding the importance of this vaccine. Patient has been advised to call insurance company to determine out of pocket expense if they have not yet received this vaccine. Advised may also receive vaccine at local pharmacy or Health Dept. Verbalized acceptance and understanding. Patient declined  Screening Tests Health Maintenance  Topic Date Due   COVID-19 Vaccine (1) Never done   DTaP/Tdap/Td (1 - Tdap) Never done   Zoster Vaccines- Shingrix (1 of 2) Never done   Pneumonia Vaccine 26+ Years old (1 of 1 - PCV) Never done   INFLUENZA VACCINE  12/06/2023 (Originally 04/08/2023)   Medicare Annual Wellness (AWV)  09/21/2024   DEXA SCAN  01/18/2025   HPV VACCINES  Aged Out    Health Maintenance  Health Maintenance Due  Topic Date Due   COVID-19 Vaccine (1) Never done   DTaP/Tdap/Td (1 - Tdap) Never done   Zoster Vaccines- Shingrix (1 of 2) Never done   Pneumonia Vaccine 85+ Years old (1 of 1 - PCV) Never done    Colorectal cancer screening: No longer required.   Mammogram status: Completed 06/15/23. Repeat every year  Bone Density status: Completed 01/19/23. Results reflect: Bone density results: OSTEOPOROSIS. Repeat every 2 years.  Lung Cancer Screening: (Low Dose CT Chest recommended if Age 4-80 years, 20 pack-year currently smoking OR have quit w/in 15years.) does not qualify.   Lung Cancer Screening Referral: n/a  Additional Screening:  Hepatitis C Screening: does not qualify;   Vision Screening: Recommended annual ophthalmology exams for early detection of glaucoma and other disorders of the eye.  Dental Screening: Recommended annual dental exams for proper oral hygiene  Community Resource Referral / Chronic Care Management: CRR required this visit?  No   CCM required this visit?  No      Plan:     I have personally reviewed and noted the following in the patient's chart:   Medical and social history Use of alcohol, tobacco or illicit drugs  Current medications and supplements including opioid prescriptions. Patient is not currently taking opioid prescriptions. Functional ability and status Nutritional status Physical activity Advanced directives List of other physicians Hospitalizations, surgeries, and ER visits in previous 12 months Vitals Screenings to include cognitive, depression, and falls Referrals and appointments  In addition, I have reviewed and discussed with patient certain preventive protocols, quality metrics, and best practice recommendations. A written personalized care plan for preventive services as well as general preventive health recommendations were provided to patient.     Tora Kindred, CMA   09/22/2023   After Visit Summary: (MyChart) Due to this being a telephonic visit, the after visit summary with patients personalized plan was offered to patient via MyChart   Nurse Notes:  Declined Tdap, flu, pneumonia, covid and shingles vaccines.

## 2023-09-22 NOTE — Patient Instructions (Addendum)
 [name] , Thank you for taking time to come for your Medicare Wellness Visit. I appreciate your ongoing commitment to your health goals. Please review the following plan we discussed and let me know if I can assist you in the future.   Referrals/Orders/Follow-Ups/Clinician Recommendations: Keep up the good work!!  This is a list of the screening recommended for you and due dates:  Health Maintenance  Topic Date Due   COVID-19 Vaccine (1) Never done   DTaP/Tdap/Td vaccine (1 - Tdap) Never done   Zoster (Shingles) Vaccine (1 of 2) Never done   Pneumonia Vaccine (1 of 1 - PCV) Never done   Flu Shot  12/06/2023*   Mammogram  06/14/2024   Medicare Annual Wellness Visit  09/21/2024   DEXA scan (bone density measurement)  01/18/2025   HPV Vaccine  Aged Out  *Topic was postponed. The date shown is not the original due date.    Advanced directives: (Copy Requested) Please bring a copy of your health care power of attorney and living will to the office to be added to your chart at your convenience.  Next Medicare Annual Wellness Visit scheduled for next year: Yes, 09/27/24 @ 1:50pm (video visit)

## 2023-10-07 ENCOUNTER — Telehealth: Payer: Self-pay

## 2023-10-07 NOTE — Telephone Encounter (Signed)
Copied from CRM [id number]. Topic: General - Other >> Oct 06, 2023  1:23 PM Everette C wrote: Reason for CRM: In order for the patient to get a handicap placard in [place] additional information is needed form their PCP   Selena Batten with the [employer] has called to request additional verification from the patient's PCP that the practice is aware of [place] statute [phone] (1)(2).B as it relates to the patient and their driving   Please fax verification to Selena Batten [phone]  Please contact Kim at [phone] if needed

## 2023-10-07 NOTE — Telephone Encounter (Signed)
Is there a form that they need signed for verification?  I looked over the statute and I had already given her a completed provider verification.  Maybe she should just get an [place] handicap placard here

## 2023-10-08 NOTE — Telephone Encounter (Signed)
When contacting number provided it asks for party extension. One is not provided. Unable to verify if a form is needed.  FYI

## 2023-10-14 NOTE — Telephone Encounter (Signed)
 Something feels weird about this. I do not feel comfortable writing this letter. Let patient know that she can get a Megan Woods placard and use it year round

## 2023-10-14 NOTE — Telephone Encounter (Signed)
 Kim called back for update on request a for formal letter head stating that the PCP acknowledges the [place]  statutes.   Best contact: [phone] no extension.

## 2023-10-18 NOTE — Telephone Encounter (Signed)
 She is probably going to need a doctor licensed in [place]  to fill out the form. We can do this for [place] only

## 2023-11-25 DIAGNOSIS — H6121 Impacted cerumen, right ear: Secondary | ICD-10-CM | POA: Diagnosis not present

## 2023-11-25 DIAGNOSIS — H60542 Acute eczematoid otitis externa, left ear: Secondary | ICD-10-CM | POA: Diagnosis not present

## 2024-01-10 ENCOUNTER — Encounter: Payer: Self-pay | Admitting: Family Medicine

## 2024-01-10 ENCOUNTER — Ambulatory Visit (INDEPENDENT_AMBULATORY_CARE_PROVIDER_SITE_OTHER): Payer: Self-pay | Admitting: Family Medicine

## 2024-01-10 VITALS — BP 124/82 | HR 61 | Ht 64.0 in | Wt 116.6 lb

## 2024-01-10 DIAGNOSIS — I10 Essential (primary) hypertension: Secondary | ICD-10-CM

## 2024-01-10 DIAGNOSIS — M810 Age-related osteoporosis without current pathological fracture: Secondary | ICD-10-CM

## 2024-01-10 DIAGNOSIS — E039 Hypothyroidism, unspecified: Secondary | ICD-10-CM | POA: Diagnosis not present

## 2024-01-10 DIAGNOSIS — E782 Mixed hyperlipidemia: Secondary | ICD-10-CM | POA: Diagnosis not present

## 2024-01-10 DIAGNOSIS — R7303 Prediabetes: Secondary | ICD-10-CM | POA: Diagnosis not present

## 2024-01-10 NOTE — Assessment & Plan Note (Signed)
 Prediabetes is being monitored. - Order six-month lab work including A1c to monitor prediabetes. - Perform labs fasting.

## 2024-01-10 NOTE — Assessment & Plan Note (Signed)
 Reviewed last lipid panel Not currently on a statin Recheck FLP and CMP Discussed diet and exercise

## 2024-01-10 NOTE — Assessment & Plan Note (Signed)
 Osteoporosis is managed with calcium and vitamin D  supplementation. Bone density shows improvement in hip and femur, but slight worsening in forearm. She is concerned about balance and risk of falls, and is actively engaging in balance exercises and walking to improve stability. - Continue calcium and vitamin D  supplementation. - Encourage balance exercises and walking to improve stability. - Discuss dietary sources of calcium, including leafy greens like broccoli and kale.

## 2024-01-10 NOTE — Assessment & Plan Note (Signed)
 Hypothyroidism is managed with Synthroid  88 mcg daily. - Continue Synthroid  88 mcg daily. - Order six-month lab work to monitor thyroid  function.

## 2024-01-10 NOTE — Assessment & Plan Note (Signed)
 Blood pressure is usually well-controlled at home. Initial reading was elevated, but recheck showed improvement to 124/82 mmHg after discussion and relaxation. - Continue current antihypertensive regimen with hydrochlorothiazide  12.5 mg daily. - Recheck blood pressure at the end of the visit to confirm improvement.

## 2024-01-10 NOTE — Progress Notes (Signed)
 Established patient visit   Patient: Megan Woods   DOB: 1935-02-28   89 y.o. Female  MRN: [id number] Visit Date: 01/10/2024  Today's healthcare provider: Aden Agreste, MD   Chief Complaint  Patient presents with   Medical Management of Chronic Issues   Hypertension   Hypothyroidism   Subjective    Hypertension     Discussed the use of AI scribe software for clinical note transcription with the patient, who gave verbal consent to proceed.  History of Present Illness   Megan Woods is an 89 year old female with hypertension and osteoporosis who presents for chronic follow-up.  Her blood pressure is elevated during the visit, though it is usually better at home. She takes hydrochlorothiazide  12.5 mg daily. She has lost two pounds since her last visit, attributed to sporadic eating and chewing difficulties from dental issues. She aims to gain weight with more calorie-dense meals. She experiences balance issues and fears falling, affecting her mobility. She exercises at a senior center and walks regularly to improve balance. Osteoporosis is managed with calcium and vitamin D  supplements. Bone density tests show slight improvement in the hip and femur, but worsening in the forearm.         Medications: Outpatient Medications Prior to Visit  Medication Sig   Calcium Carbonate (CALCIUM 600 PO) Take 600 mg by mouth daily.    Cholecalciferol (VITAMIN D  PO) Take 1,000 mg by mouth daily.   estradiol  (VIVELLE -DOT) 0.0375 MG/24HR Place 1 patch onto the skin 2 (two) times a week.   hydrochlorothiazide  (HYDRODIURIL ) 12.5 MG tablet Take 1 tablet (12.5 mg total) by mouth daily.   levothyroxine  (SYNTHROID ) 88 MCG tablet TAKE 1 TABLET BY MOUTH ONCE DAILY BEFORE BREAKFAST. STOP LEVOTHYROXINE  DOSAGE   No facility-administered medications prior to visit.    Review of Systems     Objective    BP 124/82 (BP Location: Left Arm, Patient Position: Sitting, Cuff Size: Normal)    Pulse 61   Ht 5\' 4"  (1.626 m)   Wt 116 lb 9.6 oz (52.9 kg)   SpO2 100%   BMI 20.01 kg/m    Physical Exam Vitals reviewed.  Constitutional:      General: She is not in acute distress.    Appearance: Normal appearance. She is well-developed. She is not diaphoretic.  HENT:     Head: Normocephalic and atraumatic.  Eyes:     General: No scleral icterus.    Conjunctiva/sclera: Conjunctivae normal.  Neck:     Thyroid : No thyromegaly.  Cardiovascular:     Rate and Rhythm: Normal rate and regular rhythm.     Heart sounds: Normal heart sounds. No murmur heard. Pulmonary:     Effort: Pulmonary effort is normal. No respiratory distress.     Breath sounds: Normal breath sounds. No wheezing, rhonchi or rales.  Musculoskeletal:     Cervical back: Neck supple.     Right lower leg: No edema.     Left lower leg: No edema.  Lymphadenopathy:     Cervical: No cervical adenopathy.  Skin:    General: Skin is warm and dry.     Findings: No rash.  Neurological:     Mental Status: She is alert and oriented to person, place, and time. Mental status is at baseline.  Psychiatric:        Mood and Affect: Mood normal.        Behavior: Behavior normal.      No results found  for any visits on 01/10/24.  Assessment & Plan     Problem List Items Addressed This Visit       Cardiovascular and Mediastinum   Essential hypertension - Primary   Blood pressure is usually well-controlled at home. Initial reading was elevated, but recheck showed improvement to 124/82 mmHg after discussion and relaxation. - Continue current antihypertensive regimen with hydrochlorothiazide  12.5 mg daily. - Recheck blood pressure at the end of the visit to confirm improvement.      Relevant Orders   Comprehensive metabolic panel with GFR     Endocrine   Hypothyroidism   Hypothyroidism is managed with Synthroid  88 mcg daily. - Continue Synthroid  88 mcg daily. - Order six-month lab work to monitor thyroid   function.      Relevant Orders   TSH     Musculoskeletal and Integument   Osteoporosis   Osteoporosis is managed with calcium and vitamin D  supplementation. Bone density shows improvement in hip and femur, but slight worsening in forearm. She is concerned about balance and risk of falls, and is actively engaging in balance exercises and walking to improve stability. - Continue calcium and vitamin D  supplementation. - Encourage balance exercises and walking to improve stability. - Discuss dietary sources of calcium, including leafy greens like broccoli and kale.        Other   Moderate mixed hyperlipidemia not requiring statin therapy   Reviewed last lipid panel Not currently on a statin Recheck FLP and CMP Discussed diet and exercise       Relevant Orders   Comprehensive metabolic panel with GFR   Lipid panel   Prediabetes   Prediabetes is being monitored. - Order six-month lab work including A1c to monitor prediabetes. - Perform labs fasting.      Relevant Orders   Hemoglobin A1c        Return in about 6 months (around 07/12/2024) for chronic disease f/u.       Aden Agreste, MD  [facility] [phone] (phone) [phone] (fax)  [facility]

## 2024-01-11 DIAGNOSIS — R7303 Prediabetes: Secondary | ICD-10-CM | POA: Diagnosis not present

## 2024-01-11 DIAGNOSIS — I10 Essential (primary) hypertension: Secondary | ICD-10-CM | POA: Diagnosis not present

## 2024-01-11 DIAGNOSIS — E039 Hypothyroidism, unspecified: Secondary | ICD-10-CM | POA: Diagnosis not present

## 2024-01-11 DIAGNOSIS — E782 Mixed hyperlipidemia: Secondary | ICD-10-CM | POA: Diagnosis not present

## 2024-01-12 ENCOUNTER — Other Ambulatory Visit: Payer: Self-pay

## 2024-01-12 ENCOUNTER — Encounter: Payer: Self-pay | Admitting: Family Medicine

## 2024-01-12 DIAGNOSIS — R7989 Other specified abnormal findings of blood chemistry: Secondary | ICD-10-CM

## 2024-01-12 DIAGNOSIS — E039 Hypothyroidism, unspecified: Secondary | ICD-10-CM

## 2024-01-12 LAB — COMPREHENSIVE METABOLIC PANEL WITH GFR
ALT: 9 IU/L (ref 0–32)
AST: 17 IU/L (ref 0–40)
Albumin: 4.4 g/dL (ref 3.7–4.7)
Alkaline Phosphatase: 71 IU/L (ref 44–121)
BUN/Creatinine Ratio: 10 — ABNORMAL LOW (ref 12–28)
BUN: 9 mg/dL (ref 8–27)
Bilirubin Total: 0.5 mg/dL (ref 0.0–1.2)
CO2: 27 mmol/L (ref 20–29)
Calcium: 9.9 mg/dL (ref 8.7–10.3)
Chloride: 102 mmol/L (ref 96–106)
Creatinine, Ser: 0.88 mg/dL (ref 0.57–1.00)
Globulin, Total: 1.7 g/dL (ref 1.5–4.5)
Glucose: 81 mg/dL (ref 70–99)
Potassium: 4.4 mmol/L (ref 3.5–5.2)
Sodium: 142 mmol/L (ref 134–144)
Total Protein: 6.1 g/dL (ref 6.0–8.5)
eGFR: 64 mL/min/{1.73_m2} (ref >59)

## 2024-01-12 LAB — LIPID PANEL
Chol/HDL Ratio: 2.5 ratio (ref 0.0–4.4)
Cholesterol, Total: 220 mg/dL — ABNORMAL HIGH (ref 100–199)
HDL: 89 mg/dL (ref >39)
LDL Chol Calc (NIH): 120 mg/dL — ABNORMAL HIGH (ref 0–99)
Triglycerides: 65 mg/dL (ref 0–149)
VLDL Cholesterol Cal: 11 mg/dL (ref 5–40)

## 2024-01-12 LAB — HEMOGLOBIN A1C
Est. average glucose Bld gHb Est-mCnc: 120 mg/dL
Hgb A1c MFr Bld: 5.8 % — ABNORMAL HIGH (ref 4.8–5.6)

## 2024-01-12 LAB — TSH: TSH: 4.73 u[IU]/mL — ABNORMAL HIGH (ref 0.450–4.500)

## 2024-01-13 ENCOUNTER — Other Ambulatory Visit: Payer: Self-pay | Admitting: Family Medicine

## 2024-02-08 ENCOUNTER — Other Ambulatory Visit: Payer: Self-pay | Admitting: Family Medicine

## 2024-02-08 DIAGNOSIS — E039 Hypothyroidism, unspecified: Secondary | ICD-10-CM

## 2024-03-28 DIAGNOSIS — H353132 Nonexudative age-related macular degeneration, bilateral, intermediate dry stage: Secondary | ICD-10-CM | POA: Diagnosis not present

## 2024-05-05 ENCOUNTER — Other Ambulatory Visit: Payer: Self-pay | Admitting: Family Medicine

## 2024-05-09 ENCOUNTER — Ambulatory Visit: Payer: Self-pay

## 2024-05-09 ENCOUNTER — Telehealth: Payer: Self-pay

## 2024-05-09 NOTE — Telephone Encounter (Deleted)
 I spoke with pt and made aware she may have to be seen before an antibiotic is sent in. [place] Please advise.

## 2024-05-09 NOTE — Telephone Encounter (Signed)
 FYI Only or Action Required?: Action required by provider: clinical question for provider and medication request.  Patient was last seen in primary care on 01/10/2024 by Myrla Jon HERO, MD.  Called Nurse Triage reporting Dysuria.  Symptoms began several days ago.  Interventions attempted: OTC medications: AZO.  Symptoms are: unchanged.  Triage Disposition: See HCP Within 4 Hours (Or PCP Triage)  Patient/caregiver understands and will follow disposition?: No, refuses disposition                             Copied from CRM [id number]. Topic: Clinical - Red Word Triage >> May 09, 2024  2:13 PM Emylou G wrote: [facility] that prompted transfer to Nurse Triage: uti.. burning, pain, and frequency. Reason for Disposition  Side (flank) or lower back pain present  Answer Assessment - Initial Assessment Questions 1. SEVERITY: How bad is the pain?  (e.g., Scale 1-10; mild, moderate, or severe)     Rates pain an 8 3. PATTERN: Is pain present every time you urinate or just sometimes?      Every time 4. ONSET: When did the painful urination start?      Saturday  5. FEVER: Do you have a fever? If Yes, ask: What is your temperature, how was it measured, and when did it start?     States she had a low grade fever on Saturday on Sunday  6. PAST UTI: Have you had a urine infection before? If Yes, ask: When was the last time? and What happened that time?      4-5 years 7. CAUSE: What do you think is causing the painful urination?  (e.g., UTI, scratch, Herpes sore)     UTI 8. OTHER SYMPTOMS: Do you have any other symptoms? (e.g., blood in urine, flank pain, genital sores, urgency, vaginal discharge)     Urinary frequency, lower back pain, denies hematuria    Patient is out of town at the [employer]. Advised UC. Patient declined. Patient would like to ask PCP if she can call in ciprofloxacin  for her. Please advise.  Protocols used: Urination Pain -  Female-A-AH

## 2024-05-09 NOTE — Telephone Encounter (Signed)
 Called pt no response, lvm to call back regarding provider response. Ok for E2C2 to advise pt.

## 2024-05-09 NOTE — Telephone Encounter (Signed)
 See other phone note

## 2024-05-09 NOTE — Telephone Encounter (Signed)
 Spoke with pt and made aware she may need to come in to be seen before an antibiotics is sent in for her. Pt [place] and will not be able to come to [place] until then. Please advise.

## 2024-05-09 NOTE — Telephone Encounter (Signed)
 Agree that she needs to be seen to rule out stone vs pyelonephritis with the flank pain and urinary symptoms.

## 2024-05-10 DIAGNOSIS — N390 Urinary tract infection, site not specified: Secondary | ICD-10-CM | POA: Diagnosis not present

## 2024-05-10 DIAGNOSIS — R319 Hematuria, unspecified: Secondary | ICD-10-CM | POA: Diagnosis not present

## 2024-05-10 DIAGNOSIS — R300 Dysuria: Secondary | ICD-10-CM | POA: Diagnosis not present

## 2024-06-05 DIAGNOSIS — S0012XA Contusion of left eyelid and periocular area, initial encounter: Secondary | ICD-10-CM | POA: Diagnosis not present

## 2024-06-13 ENCOUNTER — Telehealth: Payer: Self-pay | Admitting: Family Medicine

## 2024-06-13 ENCOUNTER — Ambulatory Visit (INDEPENDENT_AMBULATORY_CARE_PROVIDER_SITE_OTHER): Admitting: Family Medicine

## 2024-06-13 ENCOUNTER — Encounter: Payer: Self-pay | Admitting: Family Medicine

## 2024-06-13 VITALS — BP 125/79 | HR 69 | Wt 115.4 lb

## 2024-06-13 DIAGNOSIS — H353190 Nonexudative age-related macular degeneration, unspecified eye, stage unspecified: Secondary | ICD-10-CM | POA: Diagnosis not present

## 2024-06-13 DIAGNOSIS — F419 Anxiety disorder, unspecified: Secondary | ICD-10-CM

## 2024-06-13 DIAGNOSIS — R7303 Prediabetes: Secondary | ICD-10-CM | POA: Diagnosis not present

## 2024-06-13 DIAGNOSIS — I10 Essential (primary) hypertension: Secondary | ICD-10-CM | POA: Diagnosis not present

## 2024-06-13 DIAGNOSIS — E039 Hypothyroidism, unspecified: Secondary | ICD-10-CM | POA: Diagnosis not present

## 2024-06-13 DIAGNOSIS — R2689 Other abnormalities of gait and mobility: Secondary | ICD-10-CM

## 2024-06-13 DIAGNOSIS — Z1231 Encounter for screening mammogram for malignant neoplasm of breast: Secondary | ICD-10-CM | POA: Diagnosis not present

## 2024-06-13 DIAGNOSIS — E782 Mixed hyperlipidemia: Secondary | ICD-10-CM

## 2024-06-13 MED ORDER — HYDROCHLOROTHIAZIDE 12.5 MG PO TABS: 12.5000 mg | ORAL_TABLET | Freq: Every day | ORAL | 1 refills | Status: AC

## 2024-06-13 MED ORDER — CIPROFLOXACIN HCL 250 MG PO TABS: 250.0000 mg | ORAL_TABLET | Freq: Two times a day (BID) | ORAL | 0 refills | Status: AC

## 2024-06-13 MED ORDER — LEVOTHYROXINE SODIUM 88 MCG PO TABS: 88.0000 ug | ORAL_TABLET | Freq: Every day | ORAL | 1 refills | Status: DC

## 2024-06-13 MED ORDER — ESTRADIOL 0.0375 MG/24HR TD PTTW: 1.0000 | MEDICATED_PATCH | TRANSDERMAL | 5 refills | Status: DC

## 2024-06-13 NOTE — Telephone Encounter (Signed)
 Sent mammogram order to [facility] to [phone] as requested by Dr. B on 06/13/24.

## 2024-06-13 NOTE — Patient Instructions (Addendum)
 [facility] (Imaging and Radiology) Address: [address], [place], [place] [address] Phone: [phone]

## 2024-06-13 NOTE — Progress Notes (Unsigned)
      Established patient visit   Patient: Megan Woods   DOB: [birth year]   89 y.o. Female  MRN: [id number] Visit Date: 06/13/2024  Today's healthcare provider: Jon Eva, MD   No chief complaint on file.  Subjective    HPI   Discussed the use of AI scribe software for clinical note transcription with the patient, who gave verbal consent to proceed.  History of Present Illness             Medications: Outpatient Medications Prior to Visit  Medication Sig  . Cholecalciferol (VITAMIN D  PO) Take 1,000 mg by mouth daily.  . estradiol  (VIVELLE -DOT) 0.0375 MG/24HR Place 1 patch onto the skin 2 (two) times a week.  . hydrochlorothiazide  (HYDRODIURIL ) 12.5 MG tablet Take 1 tablet by mouth once daily  . levothyroxine  (SYNTHROID ) 88 MCG tablet TAKE 1 TABLET BY MOUTH ONCE DAILY BEFORE BREAKFAST. STOP DOSAGE  . [DISCONTINUED] Calcium Carbonate (CALCIUM 600 PO) Take 600 mg by mouth daily.    No facility-administered medications prior to visit.    Review of Systems {Insert previous labs (optional):23779} {See past labs  Heme  Chem  Endocrine  Serology  Results Review (optional):1}   Objective    There were no vitals taken for this visit. {Insert last BP/Wt (optional):23777}{See vitals history (optional):1}  Physical Exam Vitals reviewed.  Constitutional:      General: She is not in acute distress.    Appearance: Normal appearance. She is well-developed. She is not diaphoretic.  HENT:     Head: Normocephalic and atraumatic.  Eyes:     General: No scleral icterus.    Conjunctiva/sclera: Conjunctivae normal.  Neck:     Thyroid : No thyromegaly.  Cardiovascular:     Rate and Rhythm: Normal rate and regular rhythm.     Heart sounds: Normal heart sounds. No murmur heard. Pulmonary:     Effort: Pulmonary effort is normal. No respiratory distress.     Breath sounds: Normal breath sounds. No wheezing, rhonchi or rales.  Genitourinary:    Comments: GYN:   External genitalia within normal limits.  Vaginal mucosa pink, moist, normal rugae.  Nonfriable cervix without lesions, no discharge or bleeding noted on speculum exam.  Bimanual exam revealed normal, nongravid uterus.  No cervical motion tenderness. No adnexal masses bilaterally.    Musculoskeletal:     Cervical back: Neck supple.     Right lower leg: No edema.     Left lower leg: No edema.  Lymphadenopathy:     Cervical: No cervical adenopathy.  Skin:    General: Skin is warm and dry.     Findings: No rash.  Neurological:     Mental Status: She is alert and oriented to person, place, and time. Mental status is at baseline.  Psychiatric:        Mood and Affect: Mood normal.        Behavior: Behavior normal.     ***change gyn exam  No results found for any visits on 06/13/24.  Assessment & Plan     Problem List Items Addressed This Visit   None                No follow-ups on file.       Jon Eva, MD  [facility] [phone] (phone) [phone] (fax)  [facility]

## 2024-06-14 ENCOUNTER — Encounter: Payer: Self-pay | Admitting: Family Medicine

## 2024-06-14 ENCOUNTER — Ambulatory Visit: Payer: Self-pay | Admitting: Family Medicine

## 2024-06-14 DIAGNOSIS — E039 Hypothyroidism, unspecified: Secondary | ICD-10-CM

## 2024-06-14 LAB — BASIC METABOLIC PANEL WITH GFR
BUN/Creatinine Ratio: 15 (ref 12–28)
BUN: 13 mg/dL (ref 8–27)
CO2: 26 mmol/L (ref 20–29)
Calcium: 9.7 mg/dL (ref 8.7–10.3)
Chloride: 100 mmol/L (ref 96–106)
Creatinine, Ser: 0.87 mg/dL (ref 0.57–1.00)
Glucose: 89 mg/dL (ref 70–99)
Potassium: 4.3 mmol/L (ref 3.5–5.2)
Sodium: 139 mmol/L (ref 134–144)
eGFR: 64 mL/min/1.73 (ref >59)

## 2024-06-14 LAB — HEMOGLOBIN A1C
Est. average glucose Bld gHb Est-mCnc: 114 mg/dL
Hgb A1c MFr Bld: 5.6 % (ref 4.8–5.6)

## 2024-06-14 LAB — TSH: TSH: 5.66 u[IU]/mL — ABNORMAL HIGH (ref 0.450–4.500)

## 2024-06-14 NOTE — Assessment & Plan Note (Signed)
 Blood pressure is well-controlled on current medication regimen. - Continue hydrochlorothiazide  12.5 mg daily

## 2024-06-14 NOTE — Assessment & Plan Note (Signed)
 Reviewed last lipid panel Not currently on a statin Recheck FLP and CMP Discussed diet and exercise

## 2024-06-14 NOTE — Assessment & Plan Note (Signed)
 Blood sugar levels are being monitored every six months. - Monitor blood sugar levels every six months

## 2024-06-14 NOTE — Assessment & Plan Note (Signed)
 TSH was slightly elevated at last check, indicating slightly insufficient thyroid  hormone replacement. Symptoms of dry skin and fatigue noted, but attributed to recent beach exposure and lifestyle changes. - Recheck TSH today - Continue current dose of Synthroid 

## 2024-06-14 NOTE — Assessment & Plan Note (Signed)
F/b optho

## 2024-06-16 DIAGNOSIS — Z1231 Encounter for screening mammogram for malignant neoplasm of breast: Secondary | ICD-10-CM | POA: Diagnosis not present

## 2024-06-16 MED ORDER — LEVOTHYROXINE SODIUM 100 MCG PO TABS: 100.0000 ug | ORAL_TABLET | Freq: Every day | ORAL | 1 refills | Status: AC

## 2024-06-16 NOTE — Telephone Encounter (Signed)
 Copied from CRM [id number]. Topic: General - Other >> Jun 16, 2024 11:11 AM Zebedee SAUNDERS wrote: Reason for CRM:Pt returning Varvara Legault E, CMA call regarding levothyroxine  (SYNTHROID ) 88 MCG tablet. Pt is taking medication and agreed to increase of 100 mcg. Pt had no further questions.

## 2024-06-16 NOTE — Telephone Encounter (Signed)
 Prescription sent in

## 2024-06-26 ENCOUNTER — Ambulatory Visit: Admitting: Family Medicine

## 2024-07-26 DIAGNOSIS — Z08 Encounter for follow-up examination after completed treatment for malignant neoplasm: Secondary | ICD-10-CM | POA: Diagnosis not present

## 2024-07-26 DIAGNOSIS — L814 Other melanin hyperpigmentation: Secondary | ICD-10-CM | POA: Diagnosis not present

## 2024-07-26 DIAGNOSIS — D1801 Hemangioma of skin and subcutaneous tissue: Secondary | ICD-10-CM | POA: Diagnosis not present

## 2024-07-26 DIAGNOSIS — Z85828 Personal history of other malignant neoplasm of skin: Secondary | ICD-10-CM | POA: Diagnosis not present

## 2024-07-26 DIAGNOSIS — L821 Other seborrheic keratosis: Secondary | ICD-10-CM | POA: Diagnosis not present

## 2024-07-26 DIAGNOSIS — H353112 Nonexudative age-related macular degeneration, right eye, intermediate dry stage: Secondary | ICD-10-CM | POA: Diagnosis not present

## 2024-07-26 DIAGNOSIS — Z7189 Other specified counseling: Secondary | ICD-10-CM | POA: Diagnosis not present

## 2024-07-26 DIAGNOSIS — H353222 Exudative age-related macular degeneration, left eye, with inactive choroidal neovascularization: Secondary | ICD-10-CM | POA: Diagnosis not present

## 2024-08-27 NOTE — Progress Notes (Signed)
 Subjective Patient ID: Megan Woods is a 89 y.o. female.    The patient is a 89 y.o. female who presents for evaluation of cough for 3 days. Patient reports associated symptoms of fatigue, runny nose, nasal congestion body aches. Patient has been using OTC meds and that has not been helping. No fever, chills, SOB, headache, nausea, vomiting or diarrhea noted.    History provided by:  Patient   Review of Systems  Constitutional:  Positive for fatigue. Negative for chills and fever.  HENT:  Positive for congestion and rhinorrhea. Negative for ear discharge, ear pain and sore throat.   Respiratory:  Positive for cough. Negative for chest tightness, shortness of breath and wheezing.   Gastrointestinal:  Negative for abdominal pain, diarrhea, nausea and vomiting.  Musculoskeletal:  Positive for arthralgias and myalgias.  Neurological:  Positive for headaches.    Patient History  Allergies: Allergies  Allergen Reactions   Fish Protein-Containing Drug Products Anaphylaxis   Penicillins Anaphylaxis, Itching, Rash, Other and Swelling    Other reaction(s): mild rash/itching   Povidone Iodine Rash   Shellfish Allergy Hives    Other reaction(s): other/intolerance I pass out  Other reaction(s): other/intolerance  I pass out  Other reaction(s): other/intolerance I pass out   Sulfa Antibiotics Rash   Sulfur Hives, Itching and Other    Other reaction(s): mild rash/itching   Iodine Itching and Other    Other Reaction(s): rash/hives    Other reaction(s): mild rash/itching  Other reaction(s): mild rash/itching    History reviewed. No pertinent past medical history. Past Surgical History:  Procedure Laterality Date   APPENDECTOMY     HYSTERECTOMY     TONSILLECTOMY     Social History   Socioeconomic History   Marital status: Not on file    Spouse name: Not on file   Number of children: Not on file   Years of education: Not on file   Highest education level:  Not on file  Occupational History   Not on file  Tobacco Use   Smoking status: Never    Passive exposure: Never   Smokeless tobacco: Never  Vaping Use   Vaping status: Never Used  Substance and Sexual Activity   Alcohol use: Never   Drug use: Never   Sexual activity: Not Currently    Partners: Male  Other Topics Concern   Not on file  Social History Narrative   Not on file   History reviewed. No pertinent family history. No current outpatient medications on file prior to visit.   No current facility-administered medications on file prior to visit.    Objective  Vitals:   08/27/24 1512  BP: 114/73  Pulse: 81  Resp: 18  Temp: 37.4 C (99.3 F)  TempSrc: Tympanic  SpO2: 96%  Weight: 49.9 kg  Height: 5' 3  PainSc: 0-No pain        OBGYN/Pregnancy Status: Hysterectomy   Physical Exam  GENERAL APPEARANCE: afebrile, non-toxic and not acutely ill-appearing in NAD. Patient is speaking in full sentences with no increased work of breathing. EYES: EOM intact, PERRLA, with no conjunctival injection noted. NOSE: Nasal mucosa is without erythema or discharge.  THROAT: No erythema, swelling, or tonsillary exudate noted bilaterally. No soft palate petechiae noted. Uvula is midline and oral mucosa is intact. NECK: Soft, supple with no cervical adenopathy noted. HEART: RRR, S1 and S2 present with no obvious murmur noted. No rubs, gallops or clicks noted. LUNGS: CTA bilaterally with no wheezing, ronchi, rales or crackles  noted.   Results for orders placed or performed in visit on 08/27/24  POCT RAPID INFLUENZA MCKESSON  Component Result   Rapid Influenza A Ag Negative   Rapid Influenza B Ag Positive (A)   Internal Quality Control Pass  POCT QuickVue Antigen Test  Component Result   Rapid Covid Negative   Internal Quality Control Pass       Procedures MDM:     1 Acute illness with systemic symptoms     Explanation of Medical Decision Making and variances  from expected care:    OTC meds for symptoms discussed with patient. See PCP soon. F/U as needed.    Unique ordered tests: Two     Assessment requiring historian other than patient: No     Independent visualization of image, tracing, or test: No     Discussion of management with another provider: No     Risk:: Low          Assessment/Plan Diagnoses and all orders for this visit:  Influenza B  Acute cough -     POCT RAPID INFLUENZA MCKESSON -     POCT QuickVue Antigen Test   Disposition Status: Home  Patient Instructions  Hydrate well. Nasal irrigation twice daily. OTC Mucinex and Pseudophedrine (Coricidin for high blood pressure patients). OTC Tylenol  or Ibuprofen to help with pain. Please take any medications prescribed today according to the written instructions. Advised the patient on their working diagnosis and we discussed RED Flag signs and symptoms and when to seek care at the ED. Patient verbalizes understanding and will RTC or seek care at the ED if symptoms persist, worsen or new symptoms arise.  Discharge instructions are discussed with the patient and they verbalized understanding. Patient is stable and in no acute distress on discharge from the clinic today.  All questions were answered prior to the patient discharge from the clinic.   Progress note signed by Fairy Jenny, PA on 08/27/24 at  3:46 PM

## 2024-09-02 ENCOUNTER — Emergency Department

## 2024-09-02 ENCOUNTER — Emergency Department
Admission: EM | Admit: 2024-09-02 | Discharge: 2024-09-02 | Disposition: A | Discharge status: Home / Self Care | Attending: Emergency Medicine | Admitting: Emergency Medicine

## 2024-09-02 ENCOUNTER — Other Ambulatory Visit: Payer: Self-pay

## 2024-09-02 DIAGNOSIS — R197 Diarrhea, unspecified: Secondary | ICD-10-CM

## 2024-09-02 DIAGNOSIS — J111 Influenza due to unidentified influenza virus with other respiratory manifestations: Secondary | ICD-10-CM | POA: Diagnosis not present

## 2024-09-02 LAB — CBC
HCT: 41.7 % (ref 36.0–46.0)
Hemoglobin: 13.9 g/dL (ref 12.0–15.0)
MCH: 30.8 pg (ref 26.0–34.0)
MCHC: 33.3 g/dL (ref 30.0–36.0)
MCV: 92.3 fL (ref 80.0–100.0)
Platelets: 229 K/uL (ref 150–400)
RBC: 4.52 MIL/uL (ref 3.87–5.11)
RDW: 12.1 % (ref 11.5–15.5)
WBC: 4.9 K/uL (ref 4.0–10.5)
nRBC: 0 % (ref 0.0–0.2)

## 2024-09-02 LAB — BASIC METABOLIC PANEL WITH GFR
Anion gap: 9 (ref 5–15)
BUN: 9 mg/dL (ref 8–23)
CO2: 26 mmol/L (ref 22–32)
Calcium: 8.8 mg/dL — ABNORMAL LOW (ref 8.9–10.3)
Chloride: 96 mmol/L — ABNORMAL LOW (ref 98–111)
Creatinine, Ser: 0.71 mg/dL (ref 0.44–1.00)
GFR, Estimated: >60 mL/min
Glucose, Bld: 121 mg/dL — ABNORMAL HIGH (ref 70–99)
Potassium: 4.1 mmol/L (ref 3.5–5.1)
Sodium: 132 mmol/L — ABNORMAL LOW (ref 135–145)

## 2024-09-02 LAB — SARS CORONAVIRUS 2 BY RT PCR: SARS Coronavirus 2 by RT PCR: NEGATIVE

## 2024-09-02 MED ORDER — SODIUM CHLORIDE 0.9 % IV SOLN: Freq: Once | INTRAVENOUS | Status: AC

## 2024-09-02 NOTE — ED Triage Notes (Signed)
 Pt to ED from home. Was seen on the 16th at [facility], was told had type B flu and needed to ride it out. Pt having diarrhea for last three days with cough and related symptoms that has not gotten better. Pt denies any CP/SOB states can still walk up flight of steps.

## 2024-09-02 NOTE — ED Provider Notes (Signed)
 "  [facility] Provider Note    Event Date/Time   First MD Initiated Contact with Patient 09/02/24 1356     (approximate)   History   Weakness  HPI  Megan Woods is a 89 y.o. female who presents with complaints of generalized weakness, fatigue, diarrhea.  Patient reports she was diagnosed with influenza B on the 21st, over the last several days she has had diarrhea which has made her feel quite weak, she is trying to stay hydrated.  No chest pain, no shortness of breath, no cough     Physical Exam   Triage Vital Signs: ED Triage Vitals  Encounter Vitals Group     BP 09/02/24 1242 128/75     Girls Systolic BP Percentile --      Girls Diastolic BP Percentile --      Boys Systolic BP Percentile --      Boys Diastolic BP Percentile --      Pulse Rate 09/02/24 1242 77     Resp 09/02/24 1242 18     Temp 09/02/24 1242 98.1 F (36.7 C)     Temp Source 09/02/24 1242 Oral     SpO2 09/02/24 1242 100 %     Weight 09/02/24 1243 49.9 kg (110 lb)     Height 09/02/24 1243 1.6 m (5' 3)     Head Circumference --      Peak Flow --      Pain Score 09/02/24 1242 0     Pain Loc --      Pain Education --      Exclude from Growth Chart --     Most recent vital signs: Vitals:   09/02/24 1242  BP: 128/75  Pulse: 77  Resp: 18  Temp: 98.1 F (36.7 C)  SpO2: 100%     General: Awake, no distress.  CV:  Good peripheral perfusion.  Resp:  Normal effort.  Abd:  No distention.  Other:     ED Results / Procedures / Treatments   Labs (all labs ordered are listed, but only abnormal results are displayed) Labs Reviewed  BASIC METABOLIC PANEL WITH GFR - Abnormal; Notable for the following components:      Result Value   Sodium 132 (*)    Chloride 96 (*)    Glucose, Bld 121 (*)    Calcium 8.8 (*)    All other components within normal limits  SARS CORONAVIRUS 2 BY RT PCR  CBC     EKG     RADIOLOGY Chest x-ray viewed interpreted by me, without acute  abnormality    PROCEDURES:  Critical Care performed:   Procedures   MEDICATIONS ORDERED IN ED: Medications  0.9 %  sodium chloride  infusion ( Intravenous New Bag/Given 09/02/24 1425)     IMPRESSION / MDM / ASSESSMENT AND PLAN / ED COURSE  I reviewed the triage vital signs and the nursing notes. Patient's presentation is most consistent with acute presentation with potential threat to life or bodily function.  Patient presents with weakness as detailed above in the setting of influenza, differential includes weakness related to the virus, significant dehydration, acute kidney injury, pneumonia  Overall well-appearing on exam, will treat with IV fluids  ----------------------------------------- 3:51 PM on 09/02/2024 ----------------------------------------- Patient is feeling much better, appropriate for discharge at this time, return precautions discussed      FINAL CLINICAL IMPRESSION(S) / ED DIAGNOSES   Final diagnoses:  Influenza  Diarrhea, unspecified type  Rx / DC Orders   ED Discharge Orders     None        Note:  This document was prepared using Dragon voice recognition software and may include unintentional dictation errors.   Arlander Charleston, MD 09/02/24 1551  "

## 2024-09-18 ENCOUNTER — Inpatient Hospital Stay: Admitting: Family Medicine

## 2024-09-21 ENCOUNTER — Ambulatory Visit (INDEPENDENT_AMBULATORY_CARE_PROVIDER_SITE_OTHER): Admitting: Family Medicine

## 2024-09-21 ENCOUNTER — Encounter: Payer: Self-pay | Admitting: Family Medicine

## 2024-09-21 VITALS — BP 110/53 | HR 82 | Temp 97.4°F | Resp 16 | Wt 108.8 lb

## 2024-09-21 DIAGNOSIS — M79662 Pain in left lower leg: Secondary | ICD-10-CM | POA: Insufficient documentation

## 2024-09-21 DIAGNOSIS — J069 Acute upper respiratory infection, unspecified: Secondary | ICD-10-CM | POA: Diagnosis not present

## 2024-09-21 DIAGNOSIS — A499 Bacterial infection, unspecified: Secondary | ICD-10-CM | POA: Diagnosis not present

## 2024-09-21 DIAGNOSIS — Z1612 Extended spectrum beta lactamase (ESBL) resistance: Secondary | ICD-10-CM | POA: Diagnosis not present

## 2024-09-21 DIAGNOSIS — N3001 Acute cystitis with hematuria: Secondary | ICD-10-CM

## 2024-09-21 MED ORDER — ESTRADIOL 0.0375 MG/24HR TD PTTW: 1.0000 | MEDICATED_PATCH | TRANSDERMAL | 5 refills | Status: AC

## 2024-09-21 NOTE — Progress Notes (Signed)
 "  Established Patient Office Visit  Subjective   Patient ID: Megan Woods, female    DOB: 1935/07/26  Age: 89 y.o. MRN: [id number]  Chief Complaint  Patient presents with   Hospitalization Follow-up    Hosp f/u .[facility] Pt wants to discuss her labs     Megan Woods presenting today for f/u after being seen in the ED and urgent care. She initially presented to the ED for URI sxs and was diagnosed with the flu. Re-presented to the urgent care with persistent URI and UTI sxs. Explained that symptoms have improved with levofloxacin. Has lost some weight due to poor appetite and dehydration during her illness. Concerned today about pain in left calf with weight bearing for the past 4 days. Pain is reproducible with palpation and has noticed very slight swelling of her left leg compared to her right. Denies h/o clots.     Review of Systems  Respiratory:  Negative for cough.   Gastrointestinal:  Positive for constipation. Negative for diarrhea.  Genitourinary:  Negative for dysuria, frequency and urgency.      Objective:     BP (!) 110/53 (BP Location: Left Arm, Patient Position: Sitting, Cuff Size: Normal)   Pulse 82   Temp (!) 97.4 F (36.3 C) (Oral)   Resp 16   Wt 108 lb 12.8 oz (49.4 kg)   SpO2 100%   BMI 19.27 kg/m  BP Readings from Last 3 Encounters:  09/21/24 (!) 110/53  09/02/24 128/75  06/13/24 125/79   Wt Readings from Last 3 Encounters:  09/21/24 108 lb 12.8 oz (49.4 kg)  09/02/24 110 lb (49.9 kg)  06/13/24 115 lb 6.4 oz (52.3 kg)      Physical Exam Constitutional:      Appearance: Normal appearance.  Cardiovascular:     Rate and Rhythm: Normal rate and regular rhythm.     Heart sounds: No murmur heard. Pulmonary:     Effort: Pulmonary effort is normal.     Breath sounds: Normal breath sounds.  Musculoskeletal:     Comments: Tenderness to palpation on left calf region. Strength 5/5 bilaterally. Negative for swelling bilaterally. Negative Homen's test.   Neurological:     Mental Status: She is alert.      No results found for any visits on 09/21/24.  Last metabolic panel Lab Results  Component Value Date   GLUCOSE 121 (H) 09/02/2024   NA 132 (L) 09/02/2024   K 4.1 09/02/2024   CL 96 (L) 09/02/2024   CO2 26 09/02/2024   BUN 9 09/02/2024   CREATININE 0.71 09/02/2024   GFRNONAA >60 09/02/2024   CALCIUM 8.8 (L) 09/02/2024   PROT 6.1 01/11/2024   ALBUMIN 4.4 01/11/2024   LABGLOB 1.7 01/11/2024   AGRATIO 2.4 (H) 01/19/2023   BILITOT 0.5 01/11/2024   ALKPHOS 71 01/11/2024   AST 17 01/11/2024   ALT 9 01/11/2024   ANIONGAP 9 09/02/2024      The ASCVD Risk score (Arnett DK, et al., 2019) failed to calculate for the following reasons:   The 2019 ASCVD risk score is only valid for ages 109 to 9   * - Cholesterol units were assumed    Assessment & Plan:   Problem List Items Addressed This Visit       Respiratory   Viral upper respiratory tract infection   Presented to ED on 12/21 and diagnosed with influenza B. CXR showed small infiltrates for possible pneumonia. Was prescribed levofloxacin with significant improvement of symptoms. Has  experienced weight loss during this time from 115 to 108. - Encouraged increased protein intake and regular meals        Other   ESBL (extended spectrum beta-lactamase) producing bacteria infection - Primary   Diagnosed with UTI on 1/8 treated with levofloxacin. Patient culture resulted with multi-resistant E.coli. Urgent care attempted to reach patient concerning these results. Symptoms have improved despite resistance to medication.  - Recheck urine culture to determine further management      Relevant Orders   Urine Culture   Pain of left calf   Four days of pain in left calf with weight bearing and palpation. Differential includes muscle strain and DVT. Less concerned for DVT given no unilateral swelling, erythema, and negative Homen's test. More likely to be muscle strain due to  tenderness to palpation and with stretch.  - Encouraged continued use - Counseled on return precautions and continued monitoring      Other Visit Diagnoses       Acute cystitis with hematuria       Relevant Orders   Urine Culture       Return for as scheduled.    Megan Woods, Medical Student  Patient seen along with MS3 student, Megan Woods. I personally evaluated this patient along with the student, and verified all aspects of the history, physical exam, and medical decision making as documented by the student. I agree with the student's documentation and have made all necessary edits.  Sereen Schaff, Jon HERO, MD, MPH [facility]    "

## 2024-09-21 NOTE — Assessment & Plan Note (Signed)
 Four days of pain in left calf with weight bearing and palpation. Differential includes muscle strain and DVT. Less concerned for DVT given no unilateral swelling, erythema, and negative Homen's test. More likely to be muscle strain due to tenderness to palpation and with stretch.  - Encouraged continued use - Counseled on return precautions and continued monitoring

## 2024-09-21 NOTE — Assessment & Plan Note (Signed)
 Diagnosed with UTI on 1/8 treated with levofloxacin. Patient culture resulted with multi-resistant E.coli. Urgent care attempted to reach patient concerning these results. Symptoms have improved despite resistance to medication.  - Recheck urine culture to determine further management

## 2024-09-21 NOTE — Assessment & Plan Note (Signed)
 Presented to ED on 12/21 and diagnosed with influenza B. CXR showed small infiltrates for possible pneumonia. Was prescribed levofloxacin with significant improvement of symptoms. Has experienced weight loss during this time from 115 to 108. - Encouraged increased protein intake and regular meals

## 2024-09-22 ENCOUNTER — Encounter: Payer: Self-pay | Admitting: Family Medicine

## 2024-09-23 LAB — URINE CULTURE

## 2024-09-23 LAB — SPECIMEN STATUS REPORT

## 2024-09-25 ENCOUNTER — Ambulatory Visit: Payer: Self-pay | Admitting: Family Medicine

## 2024-09-27 ENCOUNTER — Ambulatory Visit: Payer: Self-pay

## 2024-09-27 DIAGNOSIS — Z0000 Encounter for general adult medical examination without abnormal findings: Secondary | ICD-10-CM | POA: Diagnosis not present

## 2024-09-27 NOTE — Patient Instructions (Addendum)
 Megan Woods,  Thank you for taking the time for your Medicare Wellness Visit. I appreciate your continued commitment to your health goals. Please review the care plan we discussed, and feel free to reach out if I can assist you further.  Please note that Annual Wellness Visits do not include a physical exam. Some assessments may be limited, especially if the visit was conducted virtually. If needed, we may recommend an in-person follow-up with your provider.  Ongoing Care Seeing your primary care provider every 3 to 6 months helps us  monitor your health and provide consistent, personalized care. 01/02/25 @ 1:20 PM APPT W/ [email or web address]  Referrals If a referral was made during today's visit and you haven't received any updates within two weeks, please contact the referred provider directly to check on the status.  Recommended Screenings:  Health Maintenance  Topic Date Due   COVID-19 Vaccine (1) Never done   DTaP/Tdap/Td vaccine (1 - Tdap) Never done   Zoster (Shingles) Vaccine (1 of 2) Never done   Pneumococcal Vaccine for age over 60 (1 of 1 - PCV) Never done   Breast Cancer Screening  06/14/2024   Medicare Annual Wellness Visit  09/21/2024   Flu Shot  12/05/2024*   Osteoporosis screening with Bone Density Scan  01/18/2025   Meningitis B Vaccine  Aged Out  *Topic was postponed. The date shown is not the original due date.     Vision: Annual vision screenings are recommended for early detection of glaucoma, cataracts, and diabetic retinopathy. These exams can also reveal signs of chronic conditions such as diabetes and high blood pressure.  Dental: Annual dental screenings help detect early signs of oral cancer, gum disease, and other conditions linked to overall health, including heart disease and diabetes.  Please see the attached documents for additional preventive care recommendations.   NEXT AWV 10/03/25 @ 10:10 AM IN PERSON

## 2024-09-27 NOTE — Progress Notes (Signed)
 "  Chief Complaint  Patient presents with   Medicare Wellness     Subjective:   Megan Woods is a 89 y.o. female who presents for a Medicare Annual Wellness Visit.  Visit info / Clinical Intake: Medicare Wellness Visit Type:: Subsequent Annual Wellness Visit Persons participating in visit and providing information:: patient Medicare Wellness Visit Mode:: Telephone If telephone:: video error Since this visit was completed virtually, some vitals may be partially provided or unavailable. Missing vitals are due to the limitations of the virtual format.: Unable to obtain vitals - no equipment If Telephone or Video please confirm:: I connected with patient using audio/video enable telemedicine. I verified patient identity with two identifiers, discussed telehealth limitations, and patient agreed to proceed. Patient Location:: HOME Provider Location:: OFFICE Interpreter Needed?: No Pre-visit prep was completed: yes AWV questionnaire completed by patient prior to visit?: yes Date:: 09/21/24 Living arrangements:: (!) lives alone Patient's Overall Health Status Rating: excellent Typical amount of pain: none Does pain affect daily life?: no Are you currently prescribed opioids?: no  Dietary Habits and Nutritional Risks How many meals a day?: 3 Eats fruit and vegetables daily?: yes Most meals are obtained by: preparing own meals In the last 2 weeks, have you had any of the following?: none Diabetic:: no  Functional Status Activities of Daily Living (to include ambulation/medication): Independent Ambulation: Independent Medication Administration: Independent Home Management (perform basic housework or laundry): Independent Manage your own finances?: yes Primary transportation is: driving Concerns about vision?: no *vision screening is required for [facility]* ([facility]- [facility] IN [place], [place]) Concerns about hearing?: (!) yes Uses hearing aids?: (!) yes (BOTH EARS) Hear whispered voice?:  yes  Fall Screening Falls in the past year?: 1 Number of falls in past year: 0 Was there an injury with Fall?: 0 Fall Risk Category Calculator: 1 Patient Fall Risk Level: Low Fall Risk  Fall Risk Patient at Risk for Falls Due to: History of fall(s) Fall risk Follow up: Falls evaluation completed; Falls prevention discussed  Home and Transportation Safety: All rugs have non-skid backing?: yes All stairs or steps have railings?: yes Grab bars in the bathtub or shower?: yes Have non-skid surface in bathtub or shower?: yes Good home lighting?: yes Regular seat belt use?: yes Hospital stays in the last year:: no  Cognitive Assessment Difficulty concentrating, remembering, or making decisions? : no Will 6CIT or Mini Cog be Completed: yes What year is it?: 0 points What month is it?: 0 points Give patient an address phrase to remember (5 components): [address] [place] , [place] About what time is it?: 0 points Count backwards from 20 to 1: 0 points Say the months of the year in reverse: 0 points Repeat the address phrase from earlier: 0 points 6 CIT Score: 0 points  Advance Directives (For Healthcare) Does Patient Have a Medical Advance Directive?: No Type of Advance Directive: Living will Would patient like information on creating a medical advance directive?: No - Patient declined  Reviewed/Updated  Reviewed/Updated: Reviewed All (Medical, Surgical, Family, Medications, Allergies, Care Teams, Patient Goals)    Allergies (verified) Shellfish allergy, Sulfur, Elemental sulfur, Iodinated contrast media, Iodine, Penicillins, Sulfa antibiotics, and Chocolate   Current Medications (verified) Outpatient Encounter Medications as of 09/27/2024  Medication Sig   Cholecalciferol (VITAMIN D  PO) Take 1,000 mg by mouth daily.   estradiol  (VIVELLE -DOT) 0.0375 MG/24HR Place 1 patch onto the skin 2 (two) times a week.   hydrochlorothiazide  (HYDRODIURIL ) 12.5 MG tablet Take 1 tablet  (12.5 mg  total) by mouth daily.   levothyroxine  (SYNTHROID ) 100 MCG tablet Take 1 tablet (100 mcg total) by mouth daily.   No facility-administered encounter medications on file as of 09/27/2024.    History: Past Medical History:  Diagnosis Date   Allergy    Arthritis    Dry age-related macular degeneration    Hypertension    Hypothyroidism    Mitral valve disorder    Osteoporosis    Past Surgical History:  Procedure Laterality Date   ABDOMINAL HYSTERECTOMY  1975   also took out L ovary and cervix   APPENDECTOMY     CATARACT EXTRACTION     EYE SURGERY     Cataracts   OOPHORECTOMY     R ovary removed many years after hysterectomy for benign cyst   SMALL BOWEL REPAIR     perforated during oophorectomy   TONSILLECTOMY     Family History  Problem Relation Age of Onset   Other Mother        unknown medical history   Other Father        unknown medical history   Breast cancer Sister 74   Social History   Occupational History   Occupation: retired  Tobacco Use   Smoking status: Never   Smokeless tobacco: Never  Vaping Use   Vaping status: Never Used  Substance and Sexual Activity   Alcohol use: Not Currently    Comment: 1 glass of wine twice a month   Drug use: Never   Sexual activity: Not on file   Tobacco Counseling Counseling given: Not Answered  SDOH Screenings   Food Insecurity: No Food Insecurity (09/27/2024)  Housing: Unknown (09/27/2024)  Transportation Needs: No Transportation Needs (09/27/2024)  Utilities: Not At Risk (09/27/2024)  Alcohol Screen: Low Risk (06/12/2024)  Depression (PHQ2-9): Low Risk (09/27/2024)  Financial Resource Strain: Low Risk (06/12/2024)  Physical Activity: Sufficiently Active (09/27/2024)  Social Connections: Moderately Integrated (09/27/2024)  Stress: No Stress Concern Present (09/27/2024)  Tobacco Use: Low Risk (09/27/2024)  Health Literacy: Adequate Health Literacy (09/27/2024)   See flowsheets for full screening  details  Depression Screen PHQ 2 & 9 Depression Scale- Over the past 2 weeks, how often have you been bothered by any of the following problems? Little interest or pleasure in doing things: 0 Feeling down, depressed, or hopeless (PHQ Adolescent also includes...irritable): 0 PHQ-2 Total Score: 0 Trouble falling or staying asleep, or sleeping too much: 0 Feeling tired or having little energy: 0 Poor appetite or overeating (PHQ Adolescent also includes...weight loss): 0 Feeling bad about yourself - or that you are a failure or have let yourself or your family down: 0 Trouble concentrating on things, such as reading the newspaper or watching television (PHQ Adolescent also includes...like school work): 0 Moving or speaking so slowly that other people could have noticed. Or the opposite - being so fidgety or restless that you have been moving around a lot more than usual: 0 Thoughts that you would be better off dead, or of hurting yourself in some way: 0 PHQ-9 Total Score: 0 If you checked off any problems, how difficult have these problems made it for you to do your work, take care of things at home, or get along with other people?: Not difficult at all  Depression Treatment Depression Interventions/Treatment : EYV7-0 Score <4 Follow-up Not Indicated     Goals Addressed             This Visit's Progress    DIET - INCREASE  WATER INTAKE               Objective:    There were no vitals filed for this visit. There is no height or weight on file to calculate BMI.  Hearing/Vision screen No results found. Immunizations and Health Maintenance Health Maintenance  Topic Date Due   COVID-19 Vaccine (1) Never done   DTaP/Tdap/Td (1 - Tdap) Never done   Zoster Vaccines- Shingrix (1 of 2) Never done   Pneumococcal Vaccine: 50+ Years (1 of 1 - PCV) Never done   Mammogram  06/14/2024   Medicare Annual Wellness (AWV)  09/21/2024   Influenza Vaccine  12/05/2024 (Originally 04/07/2024)    Bone Density Scan  01/18/2025   Meningococcal B Vaccine  Aged Out        Assessment/Plan:  This is a routine wellness examination for Megan Woods.  Patient Care Team: Myrla Jon HERO, MD as PCP - General (Family Medicine) Romine, Montie SQUIBB, MD (Obstetrics and Gynecology)  I have personally reviewed and noted the following in the patients chart:   Medical and social history Use of alcohol, tobacco or illicit drugs  Current medications and supplements including opioid prescriptions. Functional ability and status Nutritional status Physical activity Advanced directives List of other physicians Hospitalizations, surgeries, and ER visits in previous 12 months Vitals Screenings to include cognitive, depression, and falls Referrals and appointments  No orders of the defined types were placed in this encounter.  In addition, I have reviewed and discussed with patient certain preventive protocols, quality metrics, and best practice recommendations. A written personalized care plan for preventive services as well as general preventive health recommendations were provided to patient.   Jhonnie GORMAN Das, LPN   [id number]   Return in about 1 year (around 09/27/2025).  After Visit Summary: (MyChart) Due to this being a telephonic visit, the after visit summary with patients personalized plan was offered to patient via MyChart   Nurse Notes: DECLINES ALL VACCINES; UTD ON MAMMOGRAM; AGED OUT OF BDS & COLONOSCOPY  "

## 2025-01-02 ENCOUNTER — Ambulatory Visit: Admitting: Family Medicine

## 2025-10-03 ENCOUNTER — Ambulatory Visit
